# Patient Record
Sex: Female | Born: 1990 | Race: White | Hispanic: No | Marital: Single | Smoking: Current every day smoker
Health system: Southern US, Community
[De-identification: ages and names within clinical notes are randomized; demographics above are authoritative.]

## PROBLEM LIST (undated history)

## (undated) ENCOUNTER — Inpatient Hospital Stay: Payer: Self-pay

## (undated) DIAGNOSIS — A6009 Herpesviral infection of other urogenital tract: Secondary | ICD-10-CM

## (undated) DIAGNOSIS — D649 Anemia, unspecified: Secondary | ICD-10-CM

## (undated) DIAGNOSIS — N83209 Unspecified ovarian cyst, unspecified side: Secondary | ICD-10-CM

## (undated) HISTORY — PX: FRACTURE SURGERY: SHX138

## (undated) HISTORY — PX: TONSILLECTOMY: SUR1361

## (undated) HISTORY — DX: Unspecified ovarian cyst, unspecified side: N83.209

---
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
## 2010-01-16 ENCOUNTER — Ambulatory Visit: Payer: Self-pay | Admitting: Family Medicine

## 2011-02-01 ENCOUNTER — Observation Stay: Payer: Self-pay

## 2011-03-19 ENCOUNTER — Observation Stay: Payer: Self-pay

## 2011-03-23 ENCOUNTER — Inpatient Hospital Stay: Payer: Self-pay | Admitting: Obstetrics and Gynecology

## 2014-02-13 ENCOUNTER — Ambulatory Visit: Payer: Self-pay | Admitting: Emergency Medicine

## 2014-02-13 LAB — URINALYSIS, COMPLETE
Bilirubin,UR: NEGATIVE
Glucose,UR: NEGATIVE mg/dL (ref 0–75)
KETONE: NEGATIVE
Nitrite: POSITIVE
PH: 6 (ref 4.5–8.0)
Specific Gravity: >=1.03 (ref 1.003–1.030)

## 2014-02-20 ENCOUNTER — Ambulatory Visit: Payer: Self-pay

## 2014-02-20 LAB — URINALYSIS, COMPLETE
Bilirubin,UR: NEGATIVE
GLUCOSE, UR: NEGATIVE mg/dL (ref 0–75)
Ketone: NEGATIVE
LEUKOCYTE ESTERASE: NEGATIVE
NITRITE: NEGATIVE
Ph: 6 (ref 4.5–8.0)
Protein: NEGATIVE
SPECIFIC GRAVITY: 1.01 (ref 1.003–1.030)

## 2014-02-20 LAB — PREGNANCY, URINE: PREGNANCY TEST, URINE: NEGATIVE m[IU]/mL

## 2014-02-22 LAB — URINE CULTURE

## 2014-02-26 ENCOUNTER — Ambulatory Visit: Payer: Self-pay | Admitting: Physician Assistant

## 2014-02-26 LAB — CBC WITH DIFFERENTIAL/PLATELET
BASOS PCT: 0.7 %
Basophil #: 0.1 10*3/uL (ref 0.0–0.1)
EOS PCT: 0.9 %
Eosinophil #: 0.1 10*3/uL (ref 0.0–0.7)
HCT: 38.4 % (ref 35.0–47.0)
HGB: 12.8 g/dL (ref 12.0–16.0)
LYMPHS PCT: 26.3 %
Lymphocyte #: 2 10*3/uL (ref 1.0–3.6)
MCH: 29.7 pg (ref 26.0–34.0)
MCHC: 33.3 g/dL (ref 32.0–36.0)
MCV: 89 fL (ref 80–100)
MONO ABS: 0.5 x10 3/mm (ref 0.2–0.9)
MONOS PCT: 6.1 %
Neutrophil #: 4.9 10*3/uL (ref 1.4–6.5)
Neutrophil %: 66 %
Platelet: 227 10*3/uL (ref 150–440)
RBC: 4.31 10*6/uL (ref 3.80–5.20)
RDW: 13.6 % (ref 11.5–14.5)
WBC: 7.4 10*3/uL (ref 3.6–11.0)

## 2014-02-26 LAB — URINALYSIS, COMPLETE
BILIRUBIN, UR: NEGATIVE
Blood: NEGATIVE
GLUCOSE, UR: NEGATIVE mg/dL (ref 0–75)
Ketone: NEGATIVE
Leukocyte Esterase: NEGATIVE
NITRITE: NEGATIVE
Ph: 8 (ref 4.5–8.0)
Protein: NEGATIVE
Specific Gravity: 1.01 (ref 1.003–1.030)

## 2014-02-26 LAB — BASIC METABOLIC PANEL
Anion Gap: 6 — ABNORMAL LOW (ref 7–16)
BUN: 13 mg/dL (ref 7–18)
CO2: 31 mmol/L (ref 21–32)
Calcium, Total: 9.1 mg/dL (ref 8.5–10.1)
Chloride: 102 mmol/L (ref 98–107)
Creatinine: 0.81 mg/dL (ref 0.60–1.30)
EGFR (African American): >60
EGFR (Non-African Amer.): >60
GLUCOSE: 88 mg/dL (ref 65–99)
Osmolality: 277 (ref 275–301)
Potassium: 3.7 mmol/L (ref 3.5–5.1)
Sodium: 139 mmol/L (ref 136–145)

## 2014-07-24 ENCOUNTER — Ambulatory Visit: Payer: Self-pay | Admitting: Otolaryngology

## 2015-04-08 ENCOUNTER — Other Ambulatory Visit: Payer: Self-pay | Admitting: Nurse Practitioner

## 2015-04-08 DIAGNOSIS — R1032 Left lower quadrant pain: Secondary | ICD-10-CM

## 2015-04-08 DIAGNOSIS — K5909 Other constipation: Secondary | ICD-10-CM

## 2015-04-15 ENCOUNTER — Ambulatory Visit: Payer: Self-pay

## 2015-04-24 ENCOUNTER — Ambulatory Visit
Admission: RE | Admit: 2015-04-24 | Discharge: 2015-04-24 | Disposition: A | Discharge status: Home / Self Care | Payer: Managed Care, Other (non HMO) | Source: Ambulatory Visit | Attending: Nurse Practitioner | Admitting: Nurse Practitioner

## 2015-04-24 DIAGNOSIS — R1032 Left lower quadrant pain: Secondary | ICD-10-CM | POA: Diagnosis present

## 2015-04-24 DIAGNOSIS — K5909 Other constipation: Secondary | ICD-10-CM | POA: Diagnosis present

## 2015-04-24 DIAGNOSIS — N8320 Unspecified ovarian cysts: Secondary | ICD-10-CM | POA: Diagnosis not present

## 2015-04-24 MED ORDER — IOHEXOL 300 MG/ML  SOLN
100.0000 mL | Freq: Once | INTRAMUSCULAR | Status: AC | PRN
  Administered 2015-04-24: 100 mL via INTRAVENOUS

## 2015-11-22 NOTE — L&D Delivery Note (Signed)
Delivery Note Primary OB: [facility] Delivery Physician: Annamarie MajorPaul Harris, MD Gestational Age: Full term Antepartum complications: none Intrapartum complications: None  A viable Female was delivered via vertex perentation.  Apgars:7 ,9  Weight:  8 lb 6 oz .   Placenta status: spontaneous and Intact.  Cord: 3+ vessels;  with the following complications: none.  Anesthesia:  none Episiotomy:  none Lacerations:  none Suture Repair: none Est. Blood Loss (mL):  200 mL  Mom to postpartum.  Baby to Couplet care / Skin to Skin.  Annamarie MajorPaul Harris, MD Dept of OB/GYN [phone]

## 2015-12-19 LAB — OB RESULTS CONSOLE HIV ANTIBODY (ROUTINE TESTING): HIV: NONREACTIVE

## 2015-12-19 LAB — OB RESULTS CONSOLE ABO/RH: RH TYPE: POSITIVE

## 2015-12-19 LAB — OB RESULTS CONSOLE HEPATITIS B SURFACE ANTIGEN: Hepatitis B Surface Ag: NEGATIVE

## 2015-12-19 LAB — OB RESULTS CONSOLE VARICELLA ZOSTER ANTIBODY, IGG: Varicella: IMMUNE

## 2015-12-19 LAB — OB RESULTS CONSOLE RPR: RPR: NONREACTIVE

## 2015-12-19 LAB — OB RESULTS CONSOLE RUBELLA ANTIBODY, IGM: RUBELLA: IMMUNE

## 2015-12-19 LAB — OB RESULTS CONSOLE ANTIBODY SCREEN: Antibody Screen: NEGATIVE

## 2016-02-19 ENCOUNTER — Emergency Department
Admission: EM | Admit: 2016-02-19 | Discharge: 2016-02-19 | Disposition: A | Discharge status: Home / Self Care | Payer: BLUE CROSS/BLUE SHIELD | Attending: Emergency Medicine | Admitting: Emergency Medicine

## 2016-02-19 ENCOUNTER — Encounter: Payer: Self-pay | Admitting: Emergency Medicine

## 2016-02-19 DIAGNOSIS — Z3A16 16 weeks gestation of pregnancy: Secondary | ICD-10-CM | POA: Insufficient documentation

## 2016-02-19 DIAGNOSIS — O99612 Diseases of the digestive system complicating pregnancy, second trimester: Secondary | ICD-10-CM | POA: Insufficient documentation

## 2016-02-19 DIAGNOSIS — K929 Disease of digestive system, unspecified: Secondary | ICD-10-CM | POA: Insufficient documentation

## 2016-02-19 DIAGNOSIS — O210 Mild hyperemesis gravidarum: Secondary | ICD-10-CM

## 2016-02-19 LAB — URINALYSIS COMPLETE WITH MICROSCOPIC (ARMC ONLY)
Bacteria, UA: NONE SEEN
Bilirubin Urine: NEGATIVE
Glucose, UA: NEGATIVE mg/dL
HGB URINE DIPSTICK: NEGATIVE
LEUKOCYTES UA: NEGATIVE
NITRITE: NEGATIVE
PH: 7 (ref 5.0–8.0)
PROTEIN: NEGATIVE mg/dL
SPECIFIC GRAVITY, URINE: 1.017 (ref 1.005–1.030)
WBC UA: NONE SEEN WBC/hpf (ref 0–5)

## 2016-02-19 LAB — COMPREHENSIVE METABOLIC PANEL
ALBUMIN: 3.9 g/dL (ref 3.5–5.0)
ALT: 14 U/L (ref 14–54)
ANION GAP: 5 (ref 5–15)
AST: 21 U/L (ref 15–41)
Alkaline Phosphatase: 32 U/L — ABNORMAL LOW (ref 38–126)
BUN: 6 mg/dL (ref 6–20)
CHLORIDE: 107 mmol/L (ref 101–111)
CO2: 21 mmol/L — ABNORMAL LOW (ref 22–32)
Calcium: 9.1 mg/dL (ref 8.9–10.3)
Creatinine, Ser: 0.39 mg/dL — ABNORMAL LOW (ref 0.44–1.00)
GFR calc Af Amer: >60 mL/min (ref >60)
Glucose, Bld: 83 mg/dL (ref 65–99)
POTASSIUM: 3.7 mmol/L (ref 3.5–5.1)
Sodium: 133 mmol/L — ABNORMAL LOW (ref 135–145)
TOTAL PROTEIN: 7.5 g/dL (ref 6.5–8.1)
Total Bilirubin: 0.6 mg/dL (ref 0.3–1.2)

## 2016-02-19 LAB — CBC WITH DIFFERENTIAL/PLATELET
BASOS ABS: 0 10*3/uL (ref 0–0.1)
BASOS PCT: 1 %
EOS PCT: 1 %
Eosinophils Absolute: 0 10*3/uL (ref 0–0.7)
HCT: 35.5 % (ref 35.0–47.0)
Hemoglobin: 12.3 g/dL (ref 12.0–16.0)
Lymphocytes Relative: 19 %
Lymphs Abs: 1.2 10*3/uL (ref 1.0–3.6)
MCH: 30 pg (ref 26.0–34.0)
MCHC: 34.6 g/dL (ref 32.0–36.0)
MCV: 86.5 fL (ref 80.0–100.0)
MONO ABS: 0.5 10*3/uL (ref 0.2–0.9)
MONOS PCT: 8 %
Neutro Abs: 4.5 10*3/uL (ref 1.4–6.5)
Neutrophils Relative %: 71 %
PLATELETS: 185 10*3/uL (ref 150–440)
RBC: 4.11 MIL/uL (ref 3.80–5.20)
RDW: 12.8 % (ref 11.5–14.5)
WBC: 6.3 10*3/uL (ref 3.6–11.0)

## 2016-02-19 LAB — LIPASE, BLOOD: LIPASE: 22 U/L (ref 11–51)

## 2016-02-19 MED ORDER — SODIUM CHLORIDE 0.9 % IV BOLUS (SEPSIS)
1000.0000 mL | Freq: Once | INTRAVENOUS | Status: AC
  Administered 2016-02-19: 1000 mL via INTRAVENOUS

## 2016-02-19 MED ORDER — ONDANSETRON HCL 4 MG PO TABS: 4.0000 mg | ORAL_TABLET | Freq: Every day | ORAL | Status: DC | PRN

## 2016-02-19 MED ORDER — ONDANSETRON HCL 4 MG/2ML IJ SOLN
4.0000 mg | Freq: Once | INTRAMUSCULAR | Status: AC
  Administered 2016-02-19: 4 mg via INTRAVENOUS
  Filled 2016-02-19: qty 2

## 2016-02-19 NOTE — ED Notes (Signed)
Fetal heartrate assessed, Approx 150BPM

## 2016-02-19 NOTE — ED Provider Notes (Signed)
[facility] Emergency Department Provider Note  ____________________________________________  Time seen: Approximately 3:25 PM  I have reviewed the triage vital signs and the nursing notes.   HISTORY  Chief Complaint Emesis    HPI Barbara Moreno is a 25 y.o. female who is a G2 who is presenting today with multiple episodes of vomiting since yesterday. She said that she had about 6 episodes of vomiting yesterday and several episodes this morning. She says that they have been yellow to green without any blood in them. She denies any diarrhea. She says that she has struggled with nausea and vomiting through both of her pregnancies. She says that she is already tried Diclegis as well as promethazine this pregnancy ate unsuccessfully. She says that the only thing that works for Zofran. She says that she has discussed with her OB/GYN and the possible risks of birth defects with Zofran and she is aware of them but says this is the only thing that works for her. We discussed Reglan at this time the patient says that she does not want this medication either.  She is also reporting that she was earlier having right-sided flank pain which she said was sharp and constant. She denies any right upper quadrant abdominal pain and says that she has not been having any right upper quadrant abdominal pain with eating. She does still have her gallbladder. Denies any burning with urination. Denies any bleeding or discharge from the vagina. She is a patient at [facility]. Denies any chest pain or shortness of breath. Denies any pain to the right shoulder blade as reported in the H&P. The pain is more to the right flank.   History reviewed. No pertinent past medical history.  There are no active problems to display for this patient.   History reviewed. No pertinent past surgical history.  No current outpatient prescriptions on file.  Allergies Penicillins  No family history  on file.  Social History Social History  Substance Use Topics  . Smoking status: Never Smoker   . Smokeless tobacco: None  . Alcohol Use: None    Review of Systems Constitutional: No fever/chills Eyes: No visual changes. ENT: No sore throat. Cardiovascular: Denies chest pain. Respiratory: Denies shortness of breath. Gastrointestinal: Says having lower abdominal pain but this is chronic. Pain is mild. Unchanged from previous.  No diarrhea. Genitourinary: Negative for dysuria. Musculoskeletal: Negative for back pain. Skin: Negative for rash. Neurological: Negative for headaches, focal weakness or numbness.  10-point ROS otherwise negative.  ____________________________________________   PHYSICAL EXAM:  VITAL SIGNS: ED Triage Vitals  Enc Vitals Group     BP 02/19/16 1035 119/68 mmHg     Pulse Rate 02/19/16 1035 119     Resp 02/19/16 1035 18     Temp 02/19/16 1035 98.2 F (36.8 C)     Temp Source 02/19/16 1035 Oral     SpO2 02/19/16 1035 97 %     Weight 02/19/16 1035 160 lb (72.576 kg)     Height 02/19/16 1035 5\' 6"  (1.676 m)     Head Cir --      Peak Flow --      Pain Score 02/19/16 1036 8     Pain Loc --      Pain Edu? --      Excl. in GC? --     Constitutional: Alert and oriented. Well appearing and in no acute distress. Eyes: Conjunctivae are normal. PERRL. EOMI. Head: Atraumatic. Nose: No congestion/rhinnorhea.  Mouth/Throat: Mucous membranes are moist.   Neck: No stridor.   Cardiovascular: Normal rate, regular rhythm. Grossly normal heart sounds.  Good peripheral circulation. Respiratory: Normal respiratory effort.  No retractions. Lungs CTAB. Gastrointestinal: Soft And mild tenderness across the lower abdomen with a palpable uterus about 4 cm above the pubic symphysis. No bogginess. No rebound or guarding. No distention. No CVA tenderness. No right upper quadrant tenderness to palpation. Negative Murphy sign. Musculoskeletal: No lower extremity tenderness  nor edema.  No joint effusions. Neurologic:  Normal speech and language. No gross focal neurologic deficits are appreciated. No gait instability. Skin:  Skin is warm, dry and intact. No rash noted. Psychiatric: Mood and affect are normal. Speech and behavior are normal.  ____________________________________________   LABS (all labs ordered are listed, but only abnormal results are displayed)  Labs Reviewed  COMPREHENSIVE METABOLIC PANEL - Abnormal; Notable for the following:    Sodium 133 (*)    CO2 21 (*)    Creatinine, Ser 0.39 (*)    Alkaline Phosphatase 32 (*)    All other components within normal limits  URINALYSIS COMPLETEWITH MICROSCOPIC ([facility] ONLY) - Abnormal; Notable for the following:    Color, Urine YELLOW (*)    APPearance CLEAR (*)    Ketones, ur 2+ (*)    Squamous Epithelial / LPF 6-30 (*)    All other components within normal limits  CBC WITH DIFFERENTIAL/PLATELET  LIPASE, BLOOD   ____________________________________________  EKG   ____________________________________________  RADIOLOGY   ____________________________________________   PROCEDURES   ____________________________________________   INITIAL IMPRESSION / ASSESSMENT AND PLAN / ED COURSE  Pertinent labs & imaging results that were available during my care of the patient were reviewed by me and considered in my medical decision making (see chart for details).  ----------------------------------------- 3:49 PM on 02/19/2016 ----------------------------------------- After Zofran and fluids the patient says that she feels markedly improved. She says that the pain has resolved in her right flank and she is no longer vomiting. She tolerated juice as well as crackers and says that she feels very hungry. She says that she would prefer to go home with a short course of Zofran. She is aware of the risks of birth defects but says this is the only medication that she has tried that she has had any  success with. She did not want a prescription for Reglan either. She will be following up with her OB/GYN this Tuesday. Fetal heart tones in the 150s.  ____________________________________________   FINAL CLINICAL IMPRESSION(S) / ED DIAGNOSES  Hyperemesis gravidarum.    Myrna Blazer, MD 02/19/16 [phone]

## 2016-02-19 NOTE — ED Notes (Signed)
Pt states she is [redacted]wks pregnant.  C/o pain in right shoulder blade. N/v this am.

## 2016-02-19 NOTE — Discharge Instructions (Signed)
Hyperemesis Gravidarum  Hyperemesis gravidarum is a severe form of nausea and vomiting that happens during pregnancy. Hyperemesis is worse than morning sickness. It may cause you to have nausea or vomiting all day for many days. It may keep you from eating and drinking enough food and liquids. Hyperemesis usually occurs during the first half (the first 20 weeks) of pregnancy. It often goes away once a woman is in her second half of pregnancy. However, sometimes hyperemesis continues through an entire pregnancy.   CAUSES   The cause of this condition is not completely known but is thought to be related to changes in the body's hormones when pregnant. It could be from the high level of the pregnancy hormone or an increase in estrogen in the body.   SIGNS AND SYMPTOMS    Severe nausea and vomiting.   Nausea that does not go away.   Vomiting that does not allow you to keep any food down.   Weight loss and body fluid loss (dehydration).   Having no desire to eat or not liking food you have previously enjoyed.  DIAGNOSIS   Your health care provider will do a physical exam and ask you about your symptoms. He or she may also order blood tests and urine tests to make sure something else is not causing the problem.   TREATMENT   You may only need medicine to control the problem. If medicines do not control the nausea and vomiting, you will be treated in the hospital to prevent dehydration, increased acid in the blood (acidosis), weight loss, and changes in the electrolytes in your body that may harm the unborn baby (fetus). You may need IV fluids.   HOME CARE INSTRUCTIONS    Only take over-the-counter or prescription medicines as directed by your health care provider.   Try eating a couple of dry crackers or toast in the morning before getting out of bed.   Avoid foods and smells that upset your stomach.   Avoid fatty and spicy foods.   Eat 5-6 small meals a day.   Do not drink when eating meals. Drink between  meals.   For snacks, eat high-protein foods, such as cheese.   Eat or suck on things that have ginger in them. Ginger helps nausea.   Avoid food preparation. The smell of food can spoil your appetite.   Avoid iron pills and iron in your multivitamins until after 3-4 months of being pregnant. However, consult with your health care provider before stopping any prescribed iron pills.  SEEK MEDICAL CARE IF:    Your abdominal pain increases.   You have a severe headache.   You have vision problems.   You are losing weight.  SEEK IMMEDIATE MEDICAL CARE IF:    You are unable to keep fluids down.   You vomit blood.   You have constant nausea and vomiting.   You have excessive weakness.   You have extreme thirst.   You have dizziness or fainting.   You have a fever or persistent symptoms for more than 2-3 days.   You have a fever and your symptoms suddenly get worse.  MAKE SURE YOU:    Understand these instructions.   Will watch your condition.   Will get help right away if you are not doing well or get worse.     This information is not intended to replace advice given to you by your health care provider. Make sure you discuss any questions you have with   your health care provider.     Document Released: 11/07/2005 Document Revised: 08/28/2013 Document Reviewed: 06/19/2013  Elsevier Interactive Patient Education 2016 Elsevier Inc.

## 2016-07-12 LAB — OB RESULTS CONSOLE GC/CHLAMYDIA
CHLAMYDIA, DNA PROBE: NEGATIVE
GC PROBE AMP, GENITAL: NEGATIVE

## 2016-07-12 LAB — OB RESULTS CONSOLE GBS: GBS: NEGATIVE

## 2016-08-02 ENCOUNTER — Encounter: Payer: Self-pay | Admitting: *Deleted

## 2016-08-02 ENCOUNTER — Observation Stay
Admission: EM | Admit: 2016-08-02 | Discharge: 2016-08-02 | Disposition: A | Discharge status: Home / Self Care | Payer: BLUE CROSS/BLUE SHIELD | Attending: Obstetrics and Gynecology | Admitting: Obstetrics and Gynecology

## 2016-08-02 DIAGNOSIS — O471 False labor at or after 37 completed weeks of gestation: Principal | ICD-10-CM | POA: Insufficient documentation

## 2016-08-02 DIAGNOSIS — Z3A39 39 weeks gestation of pregnancy: Secondary | ICD-10-CM | POA: Diagnosis not present

## 2016-08-02 DIAGNOSIS — O479 False labor, unspecified: Secondary | ICD-10-CM | POA: Diagnosis present

## 2016-08-02 NOTE — OB Triage Note (Signed)
Pt presents with complaint of dizziness, and "trickling of fluid" for a couple of days. Having some nausea, no vomiting. Denies headache. States that "she just hasnt felt good."

## 2016-08-02 NOTE — Discharge Summary (Signed)
Obstetric History and Physical  Barbara Moreno is a 25 y.o. G2P1 with Estimated Date of Delivery: 08/06/16 who presents at 6317w3d presenting for leaking fluid x 2 days and contraction. Patient states she has been having regular contractions, no vaginal bleeding, intact, ? Leaking clear membranes, with active fetal movement.    Prenatal Course Source of Care: [facility]  Pregnancy complications or risks: Patient Active Problem List   Diagnosis Date Noted  . Irregular contractions 08/02/2016     Prenatal Transfer Tool   No past medical history on file.  Past Surgical History:  Procedure Laterality Date  . FRACTURE SURGERY      OB History  Gravida Para Term Preterm AB Living  2 1       1   SAB TAB Ectopic Multiple Live Births               # Outcome Date GA Lbr Len/2nd Weight Sex Delivery Anes PTL Lv  2 Current           1 Para               Social History   Social History  . Marital status: Single    Spouse name: N/A  . Number of children: N/A  . Years of education: N/A   Social History Main Topics  . Smoking status: Former Games developermoker  . Smokeless tobacco: Never Used  . Alcohol use No  . Drug use: No  . Sexual activity: Not on file   Other Topics Concern  . Not on file   Social History Narrative  . No narrative on file    No family history on file.  Prescriptions Prior to Admission  Medication Sig Dispense Refill Last Dose  . ondansetron (ZOFRAN) 4 MG tablet Take 1 tablet (4 mg total) by mouth daily as needed. 5 tablet 0   PNV  Allergies  Allergen Reactions  . Penicillins     hives    Review of Systems: Negative except for what is mentioned in HPI.  Physical Exam: Temp 97.5 F (36.4 C) (Oral)   Resp 20   Ht 5\' 2"  (1.575 m)   Wt 155 lb (70.3 kg)   BMI 28.35 kg/m  GENERAL: Well-developed, well-nourished female in no acute distress.  LUNGS: Clear to auscultation bilaterally.  HEART: Regular rate and rhythm. ABDOMEN: Soft, nontender, nondistended,  gravid. EXTREMITIES: Nontender, no edema Cervical Exam: Dilatation 1-1.5cm   Effacement 50%   Station -2   Presentation: cephalic FHT: Category: 1 Baseline rate 140 bpm   Variability moderate  Accelerations present   Decelerations none Contractions: Every 6 mins   Pertinent Labs/Studies:   No results found for this or any previous visit (from the past 24 hour(s)).  Assessment : IUP at 1117w3d, early vs false labor  Plan: Discharge Home Labor precautions

## 2016-08-09 ENCOUNTER — Inpatient Hospital Stay
Admission: EM | Admit: 2016-08-09 | Discharge: 2016-08-12 | DRG: 767 | Disposition: A | Discharge status: Home / Self Care | Payer: BLUE CROSS/BLUE SHIELD | Attending: Obstetrics & Gynecology | Admitting: Obstetrics & Gynecology

## 2016-08-09 ENCOUNTER — Encounter: Payer: Self-pay | Admitting: Certified Nurse Midwife

## 2016-08-09 DIAGNOSIS — Z3A40 40 weeks gestation of pregnancy: Secondary | ICD-10-CM

## 2016-08-09 DIAGNOSIS — O9902 Anemia complicating childbirth: Secondary | ICD-10-CM | POA: Diagnosis present

## 2016-08-09 DIAGNOSIS — D62 Acute posthemorrhagic anemia: Secondary | ICD-10-CM | POA: Diagnosis present

## 2016-08-09 DIAGNOSIS — A609 Anogenital herpesviral infection, unspecified: Secondary | ICD-10-CM | POA: Diagnosis present

## 2016-08-09 DIAGNOSIS — Z302 Encounter for sterilization: Secondary | ICD-10-CM | POA: Diagnosis not present

## 2016-08-09 DIAGNOSIS — O480 Post-term pregnancy: Secondary | ICD-10-CM | POA: Diagnosis present

## 2016-08-09 DIAGNOSIS — O4103X0 Oligohydramnios, third trimester, not applicable or unspecified: Principal | ICD-10-CM | POA: Diagnosis present

## 2016-08-09 DIAGNOSIS — Z87891 Personal history of nicotine dependence: Secondary | ICD-10-CM

## 2016-08-09 DIAGNOSIS — O9832 Other infections with a predominantly sexual mode of transmission complicating childbirth: Secondary | ICD-10-CM | POA: Diagnosis present

## 2016-08-09 DIAGNOSIS — O4100X0 Oligohydramnios, unspecified trimester, not applicable or unspecified: Secondary | ICD-10-CM | POA: Diagnosis present

## 2016-08-09 HISTORY — DX: Anemia, unspecified: D64.9

## 2016-08-09 HISTORY — DX: Herpesviral infection of other urogenital tract: A60.09

## 2016-08-09 LAB — CBC
HCT: 37.1 % (ref 35.0–47.0)
Hemoglobin: 12.7 g/dL (ref 12.0–16.0)
MCH: 28.7 pg (ref 26.0–34.0)
MCHC: 34.2 g/dL (ref 32.0–36.0)
MCV: 83.9 fL (ref 80.0–100.0)
PLATELETS: 150 10*3/uL (ref 150–440)
RBC: 4.42 MIL/uL (ref 3.80–5.20)
RDW: 13.8 % (ref 11.5–14.5)
WBC: 7.8 10*3/uL (ref 3.6–11.0)

## 2016-08-09 LAB — CHLAMYDIA/NGC RT PCR (ARMC ONLY)
Chlamydia Tr: NOT DETECTED
N gonorrhoeae: NOT DETECTED

## 2016-08-09 LAB — TYPE AND SCREEN
ABO/RH(D): A POS
Antibody Screen: NEGATIVE

## 2016-08-09 MED ORDER — MISOPROSTOL 200 MCG PO TABS: 800.0000 ug | ORAL_TABLET | Freq: Once | ORAL | Status: DC | PRN

## 2016-08-09 MED ORDER — ONDANSETRON 4 MG PO TBDP
8.0000 mg | ORAL_TABLET | Freq: Once | ORAL | Status: AC
  Administered 2016-08-09: 8 mg via ORAL

## 2016-08-09 MED ORDER — LACTATED RINGERS IV SOLN: 500.0000 mL | INTRAVENOUS | Status: DC | PRN

## 2016-08-09 MED ORDER — LIDOCAINE HCL (PF) 1 % IJ SOLN: 30.0000 mL | INTRAMUSCULAR | Status: DC | PRN

## 2016-08-09 MED ORDER — DINOPROSTONE 10 MG VA INST
10.0000 mg | VAGINAL_INSERT | Freq: Once | VAGINAL | Status: AC
  Administered 2016-08-09: 10 mg via VAGINAL
  Filled 2016-08-09 (×2): qty 1

## 2016-08-09 MED ORDER — ONDANSETRON 4 MG PO TBDP
8.0000 mg | ORAL_TABLET | Freq: Once | ORAL | Status: DC
  Filled 2016-08-09: qty 2

## 2016-08-09 MED ORDER — LACTATED RINGERS IV SOLN: INTRAVENOUS | Status: DC

## 2016-08-09 MED ORDER — OXYTOCIN 40 UNITS IN LACTATED RINGERS INFUSION - SIMPLE MED: 2.5000 [IU]/h | INTRAVENOUS | Status: DC

## 2016-08-09 MED ORDER — SODIUM CHLORIDE FLUSH 0.9 % IV SOLN
INTRAVENOUS | Status: AC
  Administered 2016-08-10: 10 mL
  Filled 2016-08-09: qty 10

## 2016-08-09 MED ORDER — ONDANSETRON HCL 4 MG/2ML IJ SOLN: 4.0000 mg | Freq: Four times a day (QID) | INTRAMUSCULAR | Status: DC | PRN

## 2016-08-09 MED ORDER — AMMONIA AROMATIC IN INHA: 0.3000 mL | Freq: Once | RESPIRATORY_TRACT | Status: DC | PRN

## 2016-08-09 MED ORDER — OXYTOCIN BOLUS FROM INFUSION: 500.0000 mL | Freq: Once | INTRAVENOUS | Status: DC

## 2016-08-09 NOTE — H&P (Signed)
OB History & Physical   History of Present Illness:  Chief Complaint:   HPI:  Barbara Moreno is a 25 y.o. 452P1001 female with EDC=08/06/2016 at 7667w3d dated by her LMP=8 week ultrasound.  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  Has gained 12# this pregnancy. History of macrosomic infant with G1: son weighed 8#15oz..  She presents to L&D for borderline oligohydraminos. AFI today=5cm. Has been having pelvic pressure/pain, and contractions. Has been nauseated today and vomited on a couple of occasions. Baby active  Prenatal care site: [facility] . TDAP given 06/14/2016. Desires to breast feed      Maternal Medical History:   Past Medical History:  Diagnosis Date  . Anemia   . Herpes genitalis in women     Past Surgical History:  Procedure Laterality Date  . FRACTURE SURGERY     ankle surgery  . TONSILLECTOMY      Allergies  Allergen Reactions  . Penicillins     hives    Prior to Admission medications   Medication Sig Start Date End Date Taking? Authorizing Provider  Prenatal MV-Min-FA-Omega-3 (PRENATAL GUMMIES/DHA & FA PO) Take 1 tablet by mouth daily.   Yes Historical Provider, MD  valACYclovir (VALTREX) 500 MG tablet Take 500 mg by mouth daily.   Yes Historical Provider, MD  ondansetron (ZOFRAN) 4 MG tablet Take 1 tablet (4 mg total) by mouth daily as needed. Patient taking differently: Take 8 mg by mouth every 8 (eight) hours as needed for nausea or vomiting.  02/19/16   Myrna Blazeravid Matthew Schaevitz, MD          Social History: She  reports that she has quit smoking. She has never used smokeless tobacco. She reports that she does not drink alcohol or use drugs.  Family History: family history is not on file.   Review of Systems: Negative x 10 systems reviewed except as noted in the HPI.       Physical Exam:  Vital Signs: 102/66. 97.1-97 General: no acute distress.  HEENT: normocephalic, atraumatic Heart: regular rate & rhythm.  No murmurs Lungs: clear to auscultation bilaterally Abdomen: soft, gravid, non-tender;  EFW: 7 1/2# Pelvic:   External: Normal external female genitalia  Cervix: 1-2/50%/-3 per Dr Tiburcio PeaHarris' exam in office  Extremities: non-tender, symmetric, no edema bilaterally.  DTRs: Neurologic: Alert & oriented x 3.    Pertinent Results:  Prenatal Labs: Blood type/Rh A positive  Antibody screen negative  Rubella Varicella Immune immune  RPR Non reactive  HBsAg negative  HIV Non reactive  GC negative  Chlamydia negative  Genetic screening declined  1 hour GTT 99  3 hour GTT NA  GBS negative on 07/12/16   FHR: 135 with accelerations to 180s with moderate variability Toco: irregular contractions    Assessment:  Barbara Moreno is a 25 y.o. 582P1001 female at 1067w3d with borderline oligo at term  Plan:  1. Admit to Labor & Delivery for induction of labor  2. Zofran po for nausea-can eat prior to starting induction if desires 3. GBS negative   4. Consents obtained. 5. Plan for Cervidil induction tonight after moving to LDR  Tabius Rood  08/09/2016 5:26 PM

## 2016-08-09 NOTE — Plan of Care (Signed)
Pt sent over from office with AFI of 5 and postdates

## 2016-08-10 LAB — RPR: RPR Ser Ql: NONREACTIVE

## 2016-08-10 MED ORDER — SODIUM CHLORIDE 0.9% FLUSH: 3.0000 mL | Freq: Two times a day (BID) | INTRAVENOUS | Status: DC

## 2016-08-10 MED ORDER — LACTATED RINGERS IV SOLN: 500.0000 mL | INTRAVENOUS | Status: DC | PRN

## 2016-08-10 MED ORDER — ONDANSETRON HCL 4 MG/2ML IJ SOLN
4.0000 mg | INTRAMUSCULAR | Status: DC | PRN
  Administered 2016-08-11: 4 mg via INTRAVENOUS

## 2016-08-10 MED ORDER — OXYCODONE-ACETAMINOPHEN 5-325 MG PO TABS
2.0000 | ORAL_TABLET | ORAL | Status: DC | PRN
  Administered 2016-08-11: 2 via ORAL
  Filled 2016-08-10: qty 2

## 2016-08-10 MED ORDER — ACETAMINOPHEN 325 MG PO TABS: 650.0000 mg | ORAL_TABLET | ORAL | Status: DC | PRN

## 2016-08-10 MED ORDER — WITCH HAZEL-GLYCERIN EX PADS
1.0000 "application " | MEDICATED_PAD | CUTANEOUS | Status: DC | PRN
  Administered 2016-08-11: 1 via TOPICAL
  Filled 2016-08-10: qty 100

## 2016-08-10 MED ORDER — LACTATED RINGERS IV SOLN
INTRAVENOUS | Status: DC
  Administered 2016-08-10: 1000 mL via INTRAVENOUS

## 2016-08-10 MED ORDER — TRIAZOLAM 0.25 MG PO TABS: 0.2500 mg | ORAL_TABLET | Freq: Every evening | ORAL | Status: DC | PRN

## 2016-08-10 MED ORDER — BENZOCAINE-MENTHOL 20-0.5 % EX AERO
1.0000 "application " | INHALATION_SPRAY | CUTANEOUS | Status: DC | PRN
  Administered 2016-08-11: 1 via TOPICAL
  Filled 2016-08-10: qty 56

## 2016-08-10 MED ORDER — DIBUCAINE 1 % RE OINT: 1.0000 "application " | TOPICAL_OINTMENT | RECTAL | Status: DC | PRN

## 2016-08-10 MED ORDER — OXYTOCIN 40 UNITS IN LACTATED RINGERS INFUSION - SIMPLE MED
1.0000 m[IU]/min | INTRAVENOUS | Status: DC
  Administered 2016-08-10: 1 m[IU]/min via INTRAVENOUS
  Filled 2016-08-10: qty 1000

## 2016-08-10 MED ORDER — ZOLPIDEM TARTRATE 5 MG PO TABS: 5.0000 mg | ORAL_TABLET | Freq: Every evening | ORAL | Status: DC | PRN

## 2016-08-10 MED ORDER — LIDOCAINE HCL (PF) 1 % IJ SOLN
INTRAMUSCULAR | Status: AC
  Filled 2016-08-10: qty 30

## 2016-08-10 MED ORDER — OXYTOCIN BOLUS FROM INFUSION: 500.0000 mL | Freq: Once | INTRAVENOUS | Status: DC

## 2016-08-10 MED ORDER — OXYTOCIN 40 UNITS IN LACTATED RINGERS INFUSION - SIMPLE MED: 2.5000 [IU]/h | INTRAVENOUS | Status: DC

## 2016-08-10 MED ORDER — ONDANSETRON HCL 4 MG/2ML IJ SOLN
4.0000 mg | Freq: Four times a day (QID) | INTRAMUSCULAR | Status: DC | PRN
  Administered 2016-08-10: 4 mg via INTRAVENOUS
  Filled 2016-08-10: qty 2

## 2016-08-10 MED ORDER — BUTORPHANOL TARTRATE 1 MG/ML IJ SOLN
1.0000 mg | INTRAMUSCULAR | Status: DC | PRN
  Administered 2016-08-10 (×2): 1 mg via INTRAVENOUS
  Filled 2016-08-10 (×2): qty 1

## 2016-08-10 MED ORDER — TERBUTALINE SULFATE 1 MG/ML IJ SOLN
0.2500 mg | Freq: Once | INTRAMUSCULAR | Status: AC | PRN
  Administered 2016-08-10: 0.25 mg via SUBCUTANEOUS
  Filled 2016-08-10: qty 1

## 2016-08-10 MED ORDER — COCONUT OIL OIL
1.0000 "application " | TOPICAL_OIL | Status: DC | PRN
  Administered 2016-08-11: 1 via TOPICAL
  Filled 2016-08-10: qty 120

## 2016-08-10 MED ORDER — SODIUM CHLORIDE 0.9% FLUSH: 3.0000 mL | INTRAVENOUS | Status: DC | PRN

## 2016-08-10 MED ORDER — IBUPROFEN 600 MG PO TABS
600.0000 mg | ORAL_TABLET | Freq: Four times a day (QID) | ORAL | Status: DC
  Administered 2016-08-11 – 2016-08-12 (×5): 600 mg via ORAL
  Filled 2016-08-10 (×5): qty 1

## 2016-08-10 MED ORDER — MISOPROSTOL 200 MCG PO TABS
ORAL_TABLET | ORAL | Status: AC
  Filled 2016-08-10: qty 4

## 2016-08-10 MED ORDER — ONDANSETRON HCL 4 MG PO TABS: 4.0000 mg | ORAL_TABLET | ORAL | Status: DC | PRN

## 2016-08-10 MED ORDER — SODIUM CHLORIDE FLUSH 0.9 % IV SOLN
INTRAVENOUS | Status: AC
  Filled 2016-08-10: qty 10

## 2016-08-10 MED ORDER — SODIUM CHLORIDE 0.9 % IV SOLN: 250.0000 mL | INTRAVENOUS | Status: DC | PRN

## 2016-08-10 MED ORDER — SENNOSIDES-DOCUSATE SODIUM 8.6-50 MG PO TABS
2.0000 | ORAL_TABLET | ORAL | Status: DC
  Administered 2016-08-11: 2 via ORAL
  Filled 2016-08-10: qty 2

## 2016-08-10 MED ORDER — DIPHENHYDRAMINE HCL 25 MG PO CAPS: 25.0000 mg | ORAL_CAPSULE | Freq: Four times a day (QID) | ORAL | Status: DC | PRN

## 2016-08-10 MED ORDER — OXYTOCIN 10 UNIT/ML IJ SOLN
INTRAMUSCULAR | Status: AC
  Filled 2016-08-10: qty 2

## 2016-08-10 MED ORDER — OXYCODONE-ACETAMINOPHEN 5-325 MG PO TABS
1.0000 | ORAL_TABLET | ORAL | Status: DC | PRN
  Administered 2016-08-11 – 2016-08-12 (×2): 1 via ORAL
  Filled 2016-08-10 (×2): qty 1

## 2016-08-10 MED ORDER — AMMONIA AROMATIC IN INHA
RESPIRATORY_TRACT | Status: AC
  Filled 2016-08-10: qty 10

## 2016-08-10 MED ORDER — SIMETHICONE 80 MG PO CHEW: 80.0000 mg | CHEWABLE_TABLET | ORAL | Status: DC | PRN

## 2016-08-10 NOTE — Progress Notes (Signed)
L&D Note  S: Has eaten, in good spirits, contractions mild  O: 98.3-94-16 110/68 FHR: 130 with accelerations to 160s-170s, moderate variaiblity, no decelerations Toco: contractions q467min, q8 min Cervix:1.5%/50%/-2 Cervidil inserted Results for orders placed or performed during the hospital encounter of 08/09/16 (from the past 24 hour(s))  CBC     Status: None   Collection Time: 08/09/16  5:48 PM  Result Value Ref Range   WBC 7.8 3.6 - 11.0 K/uL   RBC 4.42 3.80 - 5.20 MIL/uL   Hemoglobin 12.7 12.0 - 16.0 g/dL   HCT 82.937.1 56.235.0 - 13.047.0 %   MCV 83.9 80.0 - 100.0 fL   MCH 28.7 26.0 - 34.0 pg   MCHC 34.2 32.0 - 36.0 g/dL   RDW 86.513.8 78.411.5 - 69.614.5 %   Platelets 150 150 - 440 K/uL  Type and screen [facility]     Status: None   Collection Time: 08/09/16  5:48 PM  Result Value Ref Range   ABO/RH(D) A POS    Antibody Screen NEG    Sample Expiration 08/12/2016   Chlamydia/NGC rt PCR ([facility] only)     Status: None   Collection Time: 08/09/16  5:48 PM  Result Value Ref Range   Specimen source GC/Chlam URINE, RANDOM    Chlamydia Tr NOT DETECTED NOT DETECTED   N gonorrhoeae NOT DETECTED NOT DETECTED   A: Borderline oligo  Cat 1 tracing  IOL  P: monitor for onset of labor/ fetal tolerance to labor  Pastor Sgro, CNM

## 2016-08-10 NOTE — Progress Notes (Signed)
Dr Tiburcio Peaharris in room. sve = 8cm. Pt feels like she has to push.

## 2016-08-10 NOTE — Progress Notes (Signed)
  Labor Progress Note   25 y.o. G2P1001 @ 8229w4d , admitted for  Pregnancy, Labor Management. OLIGOHYDRAMNIOS, Post Dates.  Subjective:  Painful Ctxs. Stadol now for pain.  Pitocin 11 mU/min.  Objective:  BP (!) 93/59 (BP Location: Right Arm)   Pulse 77   Temp 97.5 F (36.4 C) (Oral)   Resp 18   Ht 5\' 2"  (1.575 m)   Wt 152 lb (68.9 kg)   LMP 10/31/2015   BMI 27.80 kg/m  Abd: mild Extr: trace to 1+ bilateral pedal edema SVE: CERVIX: 3 cm dilated, 70 effaced, -2 station AROM clear  EFM: FHR: 140 bpm, variability: moderate,  accelerations:  Present,  decelerations:  Absent Toco: Frequency: Every 2 minutes Labs: I have reviewed the patient's lab results.  Assessment & Plan:  G2P1001 @ 1429w4d, admitted for  Pregnancy and Labor/Delivery Management  1. Pain management: Stadol. 2. FWB: FHT category 1.  3. ID: GBS negative 4. Labor management: Pitocin assisting labor management.  Stadol for pain and not epidural at this time.  5. AROM clear  All discussed with patient, see orders

## 2016-08-10 NOTE — Discharge Instructions (Signed)

## 2016-08-10 NOTE — Plan of Care (Signed)
fhr  Is 90-100's with good variability. Pt remains on 10 l o2 via mask . Dr Tiburcio Peaharris made aware . Will give terbutaline and continue to monitor closely.

## 2016-08-10 NOTE — Discharge Summary (Signed)
  Obstetrical Discharge Summary  Date of Admission: 08/09/2016 Date of Discharge: 08/12/2016  Discharge Diagnosis: Term Pregnancy-delivered, also postpartum BTL Primary OB:  [facility]   Gestational Age at Delivery: 2016w4d  Antepartum complications: none Date of Delivery: 08/12/2016  Delivered By: Annamarie MajorPaul Harris, MD Delivery Type: spontaneous vaginal delivery Intrapartum complications/course: None Anesthesia: none Placenta: spontaneous Laceration: none Episiotomy: none Live born FEMALE Birth Weight:  8-6 APGAR: 7, 9   Post partum course: Since the delivery, patient has tolerate activity, diet, and daily functions without difficulty or complication.  Min lochia.  No breast concerns at this time.  No signs of depression currently. Patient had pp BTL on PPD#1 with Dr. Bonney AidStaebler which occurred without incident.    BP 105/67 (BP Location: Left Arm)   Pulse 79   Temp 98.6 F (37 C) (Oral)   Resp 18   Ht 5\' 2"  (1.575 m)   Wt 152 lb (68.9 kg)   LMP 10/31/2015   SpO2 100%   Breastfeeding? Unknown   BMI 27.80 kg/m   Postpartum Exam:General appearance: alert, cooperative and no distress GI: soft, non-tender; bowel sounds normal; no masses,  no organomegaly and Fundus firm Extremities: extremities normal, atraumatic, no cyanosis or edema  Inc: Clean/dry/intact  Disposition: home with infant Rh Immune globulin given: not applicable Rubella vaccine given: no Varicella vaccine given: no Tdap vaccine given in AP or PP setting: given during prenatal care Flu vaccine given in AP or PP setting: given during prenatal care Contraception: bilateral tubal ligation  Prenatal Labs: A POS//Rubella Immune//RPR negative//HIV negative/HepB Surface Ag negative//plans to breastfeed, plans to bottle feed   Plan:  Barbara Moreno was discharged to home in good condition. [facility]  Discharge Medications:   Medication List    STOP taking these medications    ondansetron 4 MG tablet Commonly known as:  ZOFRAN   valACYclovir 500 MG tablet Commonly known as:  VALTREX     TAKE these medications   HYDROcodone-acetaminophen 5-325 MG tablet Commonly known as:  NORCO Take 1 tablet by mouth every 6 (six) hours as needed for moderate pain.   ibuprofen 600 MG tablet Commonly known as:  ADVIL,MOTRIN Take 1 tablet (600 mg total) by mouth every 6 (six) hours.   PRENATAL GUMMIES/DHA & FA PO Take 1 tablet by mouth daily.       Follow-up arrangements:  Follow-up Information    Letitia Libraobert Paul Harris, MD. Schedule an appointment as soon as possible for a visit in 6 week(s).   Specialty:  Obstetrics and Gynecology Contact information: [address] [place] [address] [phone]          Thomasene MohairStephen Jackson, MD 08/12/2016 12:29 PM

## 2016-08-10 NOTE — Progress Notes (Signed)
  Labor Progress Note   25 y.o. G2P1001 @ 377w4d , admitted for  Pregnancy, Labor Management. OLIGOHYDRAMNIOS, Post Dates.  Subjective:  Painful Ctxs.  Cervadil in for 12 hours.  Objective:  BP (!) 93/59 (BP Location: Right Arm)   Pulse 77   Temp 97.5 F (36.4 C) (Oral)   Resp 18   Ht 5\' 2"  (1.575 m)   Wt 152 lb (68.9 kg)   LMP 10/31/2015   BMI 27.80 kg/m  Abd: mild Extr: trace to 1+ bilateral pedal edema SVE: CERVIX: 2 cm dilated, 70 effaced, -2 station  EFM: FHR: 140 bpm, variability: moderate,  accelerations:  Present,  decelerations:  Absent Toco: Frequency: Every 5-7 minutes Labs: I have reviewed the patient's lab results.   Assessment & Plan:  G2P1001 @ 7477w4d, admitted for  Pregnancy and Labor/Delivery Management  1. Pain management: none. 2. FWB: FHT category 1.  3. ID: GBS negative 4. Labor management: Pitocin as next step for labor management.  Plans IV Stadol for pain and not epidural at this time.   All discussed with patient, see orders

## 2016-08-11 ENCOUNTER — Inpatient Hospital Stay: Payer: BLUE CROSS/BLUE SHIELD | Admitting: Anesthesiology

## 2016-08-11 ENCOUNTER — Encounter: Payer: Self-pay | Admitting: *Deleted

## 2016-08-11 ENCOUNTER — Encounter
Admission: EM | Disposition: A | Discharge status: Home / Self Care | Payer: Self-pay | Source: Home / Self Care | Attending: Obstetrics & Gynecology

## 2016-08-11 HISTORY — PX: TUBAL LIGATION: SHX77

## 2016-08-11 LAB — CBC
HCT: 34.7 % — ABNORMAL LOW (ref 35.0–47.0)
HEMOGLOBIN: 11.7 g/dL — AB (ref 12.0–16.0)
MCH: 28.5 pg (ref 26.0–34.0)
MCHC: 33.6 g/dL (ref 32.0–36.0)
MCV: 84.6 fL (ref 80.0–100.0)
Platelets: 169 10*3/uL (ref 150–440)
RBC: 4.1 MIL/uL (ref 3.80–5.20)
RDW: 14.4 % (ref 11.5–14.5)
WBC: 16.9 10*3/uL — ABNORMAL HIGH (ref 3.6–11.0)

## 2016-08-11 SURGERY — LIGATION, FALLOPIAN TUBE, POSTPARTUM
Anesthesia: General | Laterality: Bilateral | Wound class: Clean

## 2016-08-11 MED ORDER — SUGAMMADEX SODIUM 500 MG/5ML IV SOLN
INTRAVENOUS | Status: DC | PRN
  Administered 2016-08-11: 137.8 mg via INTRAVENOUS

## 2016-08-11 MED ORDER — ONDANSETRON HCL 4 MG/2ML IJ SOLN: 4.0000 mg | Freq: Once | INTRAMUSCULAR | Status: DC | PRN

## 2016-08-11 MED ORDER — LIDOCAINE HCL (CARDIAC) 20 MG/ML IV SOLN
INTRAVENOUS | Status: DC | PRN
  Administered 2016-08-11: 60 mg via INTRAVENOUS

## 2016-08-11 MED ORDER — ROCURONIUM BROMIDE 100 MG/10ML IV SOLN
INTRAVENOUS | Status: DC | PRN
  Administered 2016-08-11: 30 mg via INTRAVENOUS

## 2016-08-11 MED ORDER — BUPIVACAINE HCL (PF) 0.5 % IJ SOLN
INTRAMUSCULAR | Status: AC
  Filled 2016-08-11: qty 30

## 2016-08-11 MED ORDER — FENTANYL CITRATE (PF) 100 MCG/2ML IJ SOLN
25.0000 ug | INTRAMUSCULAR | Status: DC | PRN
  Administered 2016-08-11 (×4): 25 ug via INTRAVENOUS

## 2016-08-11 MED ORDER — FENTANYL CITRATE (PF) 100 MCG/2ML IJ SOLN
INTRAMUSCULAR | Status: AC
  Filled 2016-08-11: qty 2

## 2016-08-11 MED ORDER — KETOROLAC TROMETHAMINE 30 MG/ML IJ SOLN
INTRAMUSCULAR | Status: DC | PRN
  Administered 2016-08-11: 30 mg via INTRAVENOUS

## 2016-08-11 MED ORDER — BUPIVACAINE HCL 0.5 % IJ SOLN
INTRAMUSCULAR | Status: DC | PRN
Start: 2016-08-11 — End: 2016-08-11
  Administered 2016-08-11: 15 mL

## 2016-08-11 MED ORDER — LACTATED RINGERS IV SOLN
INTRAVENOUS | Status: DC
  Administered 2016-08-11 (×2): via INTRAVENOUS

## 2016-08-11 MED ORDER — MIDAZOLAM HCL 2 MG/2ML IJ SOLN
INTRAMUSCULAR | Status: DC | PRN
  Administered 2016-08-11: 2 mg via INTRAVENOUS

## 2016-08-11 MED ORDER — FENTANYL CITRATE (PF) 100 MCG/2ML IJ SOLN
INTRAMUSCULAR | Status: DC | PRN
Start: 2016-08-11 — End: 2016-08-11
  Administered 2016-08-11: 100 ug via INTRAVENOUS

## 2016-08-11 MED ORDER — PROPOFOL 10 MG/ML IV BOLUS
INTRAVENOUS | Status: DC | PRN
  Administered 2016-08-11: 150 mg via INTRAVENOUS

## 2016-08-11 SURGICAL SUPPLY — 25 items
CHLORAPREP W/TINT 26ML (MISCELLANEOUS) ×3 IMPLANT
DRAPE LAPAROTOMY 100X77 ABD (DRAPES) ×3 IMPLANT
DRESSING TELFA 4X3 1S ST N-ADH (GAUZE/BANDAGES/DRESSINGS) ×3 IMPLANT
DRSG TEGADERM 2-3/8X2-3/4 SM (GAUZE/BANDAGES/DRESSINGS) ×3 IMPLANT
ELECT REM PT RETURN 9FT ADLT (ELECTROSURGICAL) ×3
ELECTRODE REM PT RTRN 9FT ADLT (ELECTROSURGICAL) ×1 IMPLANT
GLOVE BIO SURGEON STRL SZ7 (GLOVE) ×3 IMPLANT
GLOVE INDICATOR 7.5 STRL GRN (GLOVE) ×3 IMPLANT
GOWN STRL REUS W/ TWL LRG LVL3 (GOWN DISPOSABLE) ×2 IMPLANT
GOWN STRL REUS W/TWL LRG LVL3 (GOWN DISPOSABLE) ×4
LABEL OR SOLS (LABEL) ×3 IMPLANT
LIQUID BAND (GAUZE/BANDAGES/DRESSINGS) ×3 IMPLANT
NDL SAFETY 22GX1.5 (NEEDLE) ×3 IMPLANT
NS IRRIG 500ML POUR BTL (IV SOLUTION) ×3 IMPLANT
PACK BASIN MINOR ARMC (MISCELLANEOUS) ×3 IMPLANT
SPONGE LAP 4X18 5PK (MISCELLANEOUS) ×3 IMPLANT
STRAP SAFETY BODY (MISCELLANEOUS) ×3 IMPLANT
SUT CHROMIC GUT BROWN 0 54 (SUTURE) ×1 IMPLANT
SUT CHROMIC GUT BROWN 0 54IN (SUTURE) ×3
SUT MNCRL 4-0 (SUTURE) ×2
SUT MNCRL 4-0 27XMFL (SUTURE) ×1
SUT VIC AB 0 UR5 27 (SUTURE) ×3 IMPLANT
SUT VIC AB 2-0 UR6 27 (SUTURE) ×6 IMPLANT
SUTURE MNCRL 4-0 27XMF (SUTURE) ×1 IMPLANT
SYRINGE 10CC LL (SYRINGE) ×3 IMPLANT

## 2016-08-11 NOTE — Transfer of Care (Signed)
Immediate Anesthesia Transfer of Care Note  Patient: Barbara Moreno  Procedure(s) Performed: Procedure(s): POST PARTUM TUBAL LIGATION (Bilateral)  Patient Location: PACU  Anesthesia Type:General  Level of Consciousness: sedated  Airway & Oxygen Therapy: Patient Spontanous Breathing  Post-op Assessment: Report given to RN and Post -op Vital signs reviewed and stable  Post vital signs: Reviewed and stable  Last Vitals:  Vitals:   08/11/16 0800 08/11/16 1141  BP: 104/64 (!) 144/77  Pulse: 90 94  Resp: 18 18  Temp: 36.7 C 36.4 C    Last Pain:  Vitals:   08/11/16 1141  TempSrc: Tympanic  PainSc: 0-No pain         Complications: No apparent anesthesia complications

## 2016-08-11 NOTE — Op Note (Signed)
Preoperative Diagnosis: 1) 25 y.o.  [id number] desiring permanent surgical sterilization  Postoperative Diagnosis: 1) 25 y.o. [id number] desiring permanent surgical sterilization  Operation Performed: Postpartum tuba ligation via Pomeroy method  Indication: 25 y.o. [id number]  with undesired fertility, desires permanent sterilization.  Other reversible forms of contraception were discussed with patient; she declines all other modalities. Permanent nature of as well as associated risks of the procedure discussed with patient including but not limited to: risk of regret, permanence of method, bleeding, infection, injury to surrounding organs and need for additional procedures.  Failure risk of 0.5-1% with increased risk of ectopic gestation if pregnancy occurs was also discussed with patient.    Anesthesia: General  Preoperative Antibiotics: none  Estimated Blood Loss: 2mL  Drains or Tubes: none  Implants: none  Specimens Removed: none  Complications: none  Intraoperative Findings: Normal tubes, ovaries, and uterus.  Good 1cm knuckle of tube removed each side  Patient Condition: stable  Procedure in Detail:  Patient was taken to the operating room where she was administered general anesthesia.  She was positioned in the supine position, prepped and draped in the usual sterile fashion.  Prior to proceeding with procedure a time out was performed.    Attention was turned to the patient's abdomen.  The umbilicus was infiltrated with 1% Sensorcaine, before making a stab incision using an 11 blade scalpel.  The underlying subcutaneous fat was dissected off the fascia bluntly using a hemostat and Public librarianarmy [employer] retractors.  The fascia was grasped with a hemostat, tented up and re-grasped with a second hemostat to ensure no underlying bowl was inadvertently grasped.  Tenting the fascia up it was incised using mayo scissors.  The table was put into right tilt, a tagged mini-lap was then introduced into the  incision to pack away bowl and omentum.  The left tube was identified and grasped with a babcock clamp, walked out to its fimbriated portion.  The tube was doubly suture ligated usin a 0 chromic wheel.  The intervening knuckle of tube was excised, ostia were visualized and tube was hemostatic prior to returning it to the abdomen.  The table was tilted to the left and the same procedure was repeated for the patient's right tube.    The fascia was closed using a 0 Vicryl in a running fashion.  Skin was closed using 4-0 Monocryl in a subcuticular fashion.  The incision was then dressed with dermabond.  Sponge needle and instrument counts were correct time two.  The patient tolerated the procedure well and was taken to the recovery room in stable condition.

## 2016-08-11 NOTE — Anesthesia Preprocedure Evaluation (Signed)
Anesthesia Evaluation  Patient identified by MRN, date of birth, ID band Patient awake    Reviewed: Allergy & Precautions, NPO status , Patient's Chart, lab work & pertinent test results  Airway Mallampati: II       Dental  (+) Teeth Intact   Pulmonary neg pulmonary ROS, former smoker,    Pulmonary exam normal        Cardiovascular Exercise Tolerance: Good negative cardio ROS   Rhythm:Regular Rate:Normal     Neuro/Psych negative neurological ROS     GI/Hepatic negative GI ROS, Neg liver ROS,   Endo/Other  negative endocrine ROS  Renal/GU negative Renal ROS  negative genitourinary   Musculoskeletal negative musculoskeletal ROS (+)   Abdominal Normal abdominal exam  (+)   Peds negative pediatric ROS (+)  Hematology  (+) anemia ,   Anesthesia Other Findings   Reproductive/Obstetrics                             Anesthesia Physical Anesthesia Plan  ASA: I  Anesthesia Plan: General   Post-op Pain Management:    Induction: Intravenous  Airway Management Planned: Oral ETT  Additional Equipment:   Intra-op Plan:   Post-operative Plan: Extubation in OR  Informed Consent: I have reviewed the patients History and Physical, chart, labs and discussed the procedure including the risks, benefits and alternatives for the proposed anesthesia with the patient or authorized representative who has indicated his/her understanding and acceptance.     Plan Discussed with: CRNA  Anesthesia Plan Comments:         Anesthesia Quick Evaluation

## 2016-08-11 NOTE — Progress Notes (Signed)
  Subjective:  Doing well, minimal lochia, no fevers, no chills  Objective:   Blood pressure (!) 96/45, pulse 72, temperature 98.2 F (36.8 C), temperature source Oral, resp. rate 18, height 5\' 2"  (1.575 m), weight 152 lb (68.9 kg), last menstrual period 10/31/2015, SpO2 98 %, unknown if currently breastfeeding.  General: NAD Pulmonary: no increased work of breathing Abdomen: non-distended, non-tender, fundus firm at level of umbilicus Extremities: no edema, no erythema, no tenderness  Results for orders placed or performed during the hospital encounter of 08/09/16 (from the past 72 hour(s))  CBC     Status: None   Collection Time: 08/09/16  5:48 PM  Result Value Ref Range   WBC 7.8 3.6 - 11.0 K/uL   RBC 4.42 3.80 - 5.20 MIL/uL   Hemoglobin 12.7 12.0 - 16.0 g/dL   HCT 16.137.1 09.635.0 - 04.547.0 %   MCV 83.9 80.0 - 100.0 fL   MCH 28.7 26.0 - 34.0 pg   MCHC 34.2 32.0 - 36.0 g/dL   RDW 40.913.8 81.111.5 - 91.414.5 %   Platelets 150 150 - 440 K/uL  Type and screen [facility]     Status: None   Collection Time: 08/09/16  5:48 PM  Result Value Ref Range   ABO/RH(D) A POS    Antibody Screen NEG    Sample Expiration 08/12/2016   RPR     Status: None   Collection Time: 08/09/16  5:48 PM  Result Value Ref Range   RPR Ser Ql Non Reactive Non Reactive    Comment: (NOTE) Performed At: [facility] [address] [place], [place] [id number] Mila HomerHancock William F MD YQ:[id number]:[phone]   Chlamydia/NGC rt PCR ([facility] only)     Status: None   Collection Time: 08/09/16  5:48 PM  Result Value Ref Range   Specimen source GC/Chlam URINE, RANDOM    Chlamydia Tr NOT DETECTED NOT DETECTED   N gonorrhoeae NOT DETECTED NOT DETECTED    Comment: (NOTE) 100  This methodology has not been evaluated in pregnant women or in 200  patients with a history of hysterectomy. 300 400  This methodology will not be performed on patients less than 4814  years of age.   CBC     Status: Abnormal   Collection  Time: 08/11/16  5:09 AM  Result Value Ref Range   WBC 16.9 (H) 3.6 - 11.0 K/uL   RBC 4.10 3.80 - 5.20 MIL/uL   Hemoglobin 11.7 (L) 12.0 - 16.0 g/dL   HCT 52.834.7 (L) 41.335.0 - 24.447.0 %   MCV 84.6 80.0 - 100.0 fL   MCH 28.5 26.0 - 34.0 pg   MCHC 33.6 32.0 - 36.0 g/dL   RDW 01.014.4 27.211.5 - 53.614.5 %   Platelets 169 150 - 440 K/uL    Assessment:   25 y.o. [id number] postpartum day #1 TSVD, desiring permanent surgical sterilization  Plan:    1) Acute blood loss anemia - hemodynamically stable and asymptomatic - po ferrous sulfate  2) --/--/A POS (09/19 1748) Ishmael Holter/Rubella Immune (01/28 0000) / Varicella Immune  3) TDAP status UTD  4) Breast/BTL (postpartum BTL today).  Discussed risk of regret for patient under 30, irreversible nature of the procedure, and 1% risk of lifetime failure  5) Disposition - anticipate discharge PPD2

## 2016-08-11 NOTE — Anesthesia Procedure Notes (Signed)
Procedure Name: Intubation Date/Time: 08/11/2016 12:19 PM Performed by: Almeta MonasFLETCHER, Frida Wahlstrom Pre-anesthesia Checklist: Patient identified, Emergency Drugs available, Suction available, Patient being monitored and Timeout performed Patient Re-evaluated:Patient Re-evaluated prior to inductionOxygen Delivery Method: Circle system utilized Preoxygenation: Pre-oxygenation with 100% oxygen Intubation Type: IV induction Ventilation: Mask ventilation without difficulty Laryngoscope Size: Mac and 3 Grade View: Grade I Tube type: Oral Tube size: 7.0 mm Number of attempts: 1 Airway Equipment and Method: Stylet Placement Confirmation: ETT inserted through vocal cords under direct vision,  positive ETCO2,  CO2 detector and breath sounds checked- equal and bilateral Secured at: 21 cm Tube secured with: Tape Dental Injury: Teeth and Oropharynx as per pre-operative assessment

## 2016-08-11 NOTE — Anesthesia Postprocedure Evaluation (Signed)
Anesthesia Post Note  Patient: Barbara Moreno  Procedure(s) Performed: Procedure(s) (LRB): POST PARTUM TUBAL LIGATION (Bilateral)  Patient location during evaluation: PACU Anesthesia Type: General Level of consciousness: awake Pain management: pain level controlled Vital Signs Assessment: post-procedure vital signs reviewed and stable Respiratory status: spontaneous breathing Cardiovascular status: stable Anesthetic complications: no    Last Vitals:  Vitals:   08/11/16 1335 08/11/16 1350  BP: 110/64 113/74  Pulse: 76 84  Resp: 14 13  Temp:      Last Pain:  Vitals:   08/11/16 1350  TempSrc:   PainSc: 8                  VAN STAVEREN,Anah Billard

## 2016-08-11 NOTE — OR Nursing (Signed)
IV right hand patent with LR infusing.

## 2016-08-12 ENCOUNTER — Encounter: Payer: Self-pay | Admitting: Obstetrics and Gynecology

## 2016-08-12 LAB — SURGICAL PATHOLOGY

## 2016-08-12 MED ORDER — IBUPROFEN 600 MG PO TABS: 600.0000 mg | ORAL_TABLET | Freq: Four times a day (QID) | ORAL | 0 refills | Status: AC

## 2016-08-12 MED ORDER — HYDROCODONE-ACETAMINOPHEN 5-325 MG PO TABS: 1.0000 | ORAL_TABLET | Freq: Four times a day (QID) | ORAL | 0 refills | Status: AC | PRN

## 2016-08-12 NOTE — Progress Notes (Signed)
Pt discharged home with infant.  Discharge instructions and follow up appointment given to and reviewed with pt.  Pt verbalized understanding.  Escorted by auxillary. 

## 2017-03-27 ENCOUNTER — Emergency Department
Admission: EM | Admit: 2017-03-27 | Discharge: 2017-03-27 | Disposition: A | Discharge status: Home / Self Care | Payer: Medicaid Other | Attending: Emergency Medicine | Admitting: Emergency Medicine

## 2017-03-27 ENCOUNTER — Encounter: Payer: Self-pay | Admitting: Emergency Medicine

## 2017-03-27 DIAGNOSIS — Z5321 Procedure and treatment not carried out due to patient leaving prior to being seen by health care provider: Secondary | ICD-10-CM | POA: Diagnosis not present

## 2017-03-27 DIAGNOSIS — Y929 Unspecified place or not applicable: Secondary | ICD-10-CM | POA: Insufficient documentation

## 2017-03-27 DIAGNOSIS — Y9389 Activity, other specified: Secondary | ICD-10-CM | POA: Diagnosis not present

## 2017-03-27 DIAGNOSIS — Y999 Unspecified external cause status: Secondary | ICD-10-CM | POA: Diagnosis not present

## 2017-03-27 DIAGNOSIS — Z87891 Personal history of nicotine dependence: Secondary | ICD-10-CM | POA: Insufficient documentation

## 2017-03-27 DIAGNOSIS — S0993XA Unspecified injury of face, initial encounter: Secondary | ICD-10-CM | POA: Diagnosis present

## 2017-03-27 DIAGNOSIS — R6884 Jaw pain: Secondary | ICD-10-CM | POA: Insufficient documentation

## 2017-03-27 NOTE — ED Triage Notes (Signed)
Throat auscultated, no stridor, no evidence of respiratory distress.
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
## 2017-03-29 ENCOUNTER — Encounter: Payer: Self-pay | Admitting: *Deleted

## 2017-03-29 ENCOUNTER — Telehealth: Payer: Self-pay | Admitting: Emergency Medicine

## 2017-03-29 ENCOUNTER — Ambulatory Visit
Admission: EM | Admit: 2017-03-29 | Discharge: 2017-03-29 | Disposition: A | Discharge status: Home / Self Care | Payer: Medicaid Other | Attending: Family Medicine | Admitting: Family Medicine

## 2017-03-29 ENCOUNTER — Ambulatory Visit: Payer: Medicaid Other

## 2017-03-29 DIAGNOSIS — M542 Cervicalgia: Secondary | ICD-10-CM | POA: Diagnosis not present

## 2017-03-29 DIAGNOSIS — R6884 Jaw pain: Secondary | ICD-10-CM | POA: Diagnosis present

## 2017-03-29 DIAGNOSIS — R070 Pain in throat: Secondary | ICD-10-CM | POA: Diagnosis not present
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
## 2017-03-29 NOTE — Discharge Instructions (Signed)
Tylenol and/or advil as needed, rest, ice
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
## 2018-02-01 ENCOUNTER — Other Ambulatory Visit: Payer: Self-pay

## 2018-02-01 ENCOUNTER — Telehealth: Payer: Self-pay

## 2018-02-01 MED ORDER — VALACYCLOVIR HCL 500 MG PO TABS: 500.0000 mg | ORAL_TABLET | Freq: Every day | ORAL | 0 refills | Status: AC

## 2018-02-01 NOTE — Telephone Encounter (Signed)
Pt states she needs refill of valtrex, pharm says she doesn't have refills.  [phone]  Pt states she has an outbreak.  She is requesting the one time dosing as she doesn't like to take it every day.  Pharm correct in chart.

## 2018-02-01 NOTE — Telephone Encounter (Signed)
Pt has not been seen since 2007. She needs an annual exam, but may also get medication from PCP.  Message sent to sara Paschal to schedule annual

## 2018-02-01 NOTE — Telephone Encounter (Signed)
Pt scheduled for appointment 02/12/2018 for annual with you, can you prescribe valtrex for pt?

## 2018-02-01 NOTE — Telephone Encounter (Signed)
Not in chart, looks like pt has seen [facility]. [facility]

## 2018-02-01 NOTE — Telephone Encounter (Signed)
done

## 2018-02-01 NOTE — Telephone Encounter (Signed)
Pt is schedule 02/12/18 with RPH.

## 2018-02-12 ENCOUNTER — Ambulatory Visit: Payer: Self-pay | Admitting: Obstetrics & Gynecology

## 2018-02-26 ENCOUNTER — Ambulatory Visit (INDEPENDENT_AMBULATORY_CARE_PROVIDER_SITE_OTHER): Payer: Medicaid Other | Admitting: Obstetrics and Gynecology

## 2018-02-26 ENCOUNTER — Encounter: Payer: Self-pay | Admitting: Obstetrics and Gynecology

## 2018-02-26 ENCOUNTER — Other Ambulatory Visit: Payer: Self-pay

## 2018-02-26 VITALS — BP 98/60 | HR 76 | Ht 62.0 in | Wt 125.0 lb

## 2018-02-26 DIAGNOSIS — Z0000 Encounter for general adult medical examination without abnormal findings: Secondary | ICD-10-CM | POA: Diagnosis not present

## 2018-02-26 DIAGNOSIS — Z124 Encounter for screening for malignant neoplasm of cervix: Secondary | ICD-10-CM

## 2018-02-26 DIAGNOSIS — Z01419 Encounter for gynecological examination (general) (routine) without abnormal findings: Secondary | ICD-10-CM

## 2018-02-26 NOTE — Patient Instructions (Signed)
I value your feedback and entrusting us with your care. If you get a [facility] patient survey, I would appreciate you taking the time to let us know about your experience today. Thank you! 

## 2018-02-26 NOTE — Progress Notes (Signed)
PCP:  Raynelle Bring   Chief Complaint  Patient presents with  . Gynecologic Exam    rt breast lump?     HPI:      Ms. Barbara Moreno is a 27 y.o. [id number] who LMP was Patient's last menstrual period was 02/02/2018., presents today for her annual examination.  Her menses are regular every 28-30 days, lasting 7 days.  Dysmenorrhea none. She does not have intermenstrual bleeding.  Sex activity: single partner, contraception - tubal ligation.  Last Pap: February 23, 2015  Results were: no abnormalities /neg HPV DNA  Hx of STDs: HSV  There is no FH of breast cancer. There is no FH of ovarian cancer. The patient does do self-breast exams. She has noticed a RT breast lump for several yrs. Sx were more prominent in the past but decreased when she stopped smoking a couple yrs ago. She restarted smoking again and has noticed it again the past few months. She has lost about 10# recently. She notes tenderness if she palpates it frequently, but otherwise, no other sx.   Tobacco use: The patient currently smokes 1/2 packs of cigarettes per day for the past few years. Alcohol use: social drinker No drug use.  Exercise: moderately active  She does get adequate calcium and Vitamin D in her diet.   Past Medical History:  Diagnosis Date  . Anemia   . Herpes genitalis in women   . Ovarian cyst     Past Surgical History:  Procedure Laterality Date  . FRACTURE SURGERY     ankle surgery  . TONSILLECTOMY    . TUBAL LIGATION Bilateral 08/11/2016   Procedure: POST PARTUM TUBAL LIGATION;  Surgeon: Vena Austria, MD;  Location: [facility] ORS;  Service: Gynecology;  Laterality: Bilateral;  . TUBAL LIGATION Bilateral 08/11/2016    History reviewed. No pertinent family history.  Social History   Socioeconomic History  . Marital status: Single    Spouse name: Not on file  . Number of children: Not on file  . Years of education: Not on file  . Highest education level: Not on file    Occupational History  . Not on file  Social Needs  . Financial resource strain: Not on file  . Food insecurity:    Worry: Not on file    Inability: Not on file  . Transportation needs:    Medical: Not on file    Non-medical: Not on file  Tobacco Use  . Smoking status: Current Every Day Smoker    Packs/day: 0.50    Types: Cigarettes  . Smokeless tobacco: Never Used  Substance and Sexual Activity  . Alcohol use: No  . Drug use: No  . Sexual activity: Yes    Birth control/protection: Other-see comments    Comment: Tubal  Lifestyle  . Physical activity:    Days per week: 5 days    Minutes per session: 30 min  . Stress: Not on file  Relationships  . Social connections:    Talks on phone: Not on file    Gets together: Not on file    Attends religious service: Not on file    Active member of club or organization: Not on file    Attends meetings of clubs or organizations: Not on file    Relationship status: Living with partner  . Intimate partner violence:    Fear of current or ex partner: No    Emotionally abused: No    Physically abused: No  Forced sexual activity: No  Other Topics Concern  . Not on file  Social History Narrative  . Not on file    Outpatient Medications Prior to Visit  Medication Sig Dispense Refill  . valACYclovir (VALTREX) 500 MG tablet Take 1 tablet (500 mg total) by mouth daily. 30 tablet 0  . HYDROcodone-acetaminophen (NORCO) 5-325 MG tablet Take 1 tablet by mouth every 6 (six) hours as needed for moderate pain. (Patient not taking: Reported on 02/26/2018) 20 tablet 0  . ibuprofen (ADVIL,MOTRIN) 600 MG tablet Take 1 tablet (600 mg total) by mouth every 6 (six) hours. (Patient not taking: Reported on 02/26/2018) 30 tablet 0  . Prenatal MV-Min-FA-Omega-3 (PRENATAL GUMMIES/DHA & FA PO) Take 1 tablet by mouth daily.     No facility-administered medications prior to visit.       ROS:  Review of Systems  Constitutional: Negative for fatigue, fever  and unexpected weight change.  Respiratory: Negative for cough, shortness of breath and wheezing.   Cardiovascular: Negative for chest pain, palpitations and leg swelling.  Gastrointestinal: Negative for blood in stool, constipation, diarrhea, nausea and vomiting.  Endocrine: Negative for cold intolerance, heat intolerance and polyuria.  Genitourinary: Negative for dyspareunia, dysuria, flank pain, frequency, genital sores, hematuria, menstrual problem, pelvic pain, urgency, vaginal bleeding, vaginal discharge and vaginal pain.  Musculoskeletal: Negative for back pain, joint swelling and myalgias.  Skin: Negative for rash.  Neurological: Negative for dizziness, syncope, light-headedness, numbness and headaches.  Hematological: Negative for adenopathy.  Psychiatric/Behavioral: Negative for agitation, confusion, sleep disturbance and suicidal ideas. The patient is not nervous/anxious.    BREAST: mass, tenderness   Objective: BP 98/60 (BP Location: Left Arm, Patient Position: Sitting, Cuff Size: Normal)   Pulse 76   Ht 5\' 2"  (1.575 m)   Wt 125 lb (56.7 kg)   LMP 02/02/2018   BMI 22.86 kg/m    Physical Exam  Constitutional: She is oriented to person, place, and time. She appears well-developed and well-nourished.  Genitourinary: Vagina normal and uterus normal. There is no rash or tenderness on the right labia. There is no rash or tenderness on the left labia. No erythema or tenderness in the vagina. No vaginal discharge found. Right adnexum does not display mass and does not display tenderness. Left adnexum does not display mass and does not display tenderness. Cervix does not exhibit motion tenderness or polyp. Uterus is not enlarged or tender.  Neck: Normal range of motion. No thyromegaly present.  Cardiovascular: Normal rate, regular rhythm and normal heart sounds.  No murmur heard. Pulmonary/Chest: Effort normal and breath sounds normal. Right breast exhibits no mass, no nipple  discharge, no skin change and no tenderness. Left breast exhibits no mass, no nipple discharge, no skin change and no tenderness.  AREA OF CONCERN RT BREAST IS RIB; NO DISCRETE MASSES    Abdominal: Soft. There is no tenderness. There is no guarding.  Musculoskeletal: Normal range of motion.  Neurological: She is alert and oriented to person, place, and time. No cranial nerve deficit.  Psychiatric: She has a normal mood and affect. Her behavior is normal.  Vitals reviewed.   Assessment/Plan: Encounter for annual routine gynecological examination  Cervical cancer screening - Plan: IGP, rfx Aptima HPV ASCU  Normal breast exam - Area in RT breast is rib. More likely prominent due to wt loss. Reassurance. F/u prn.          GYN counsel breast self exam, adequate intake of calcium and vitamin D, diet and exercise,  tobacco cessation     F/U  Return in about 1 year (around 02/27/2019).  Ajai Terhaar B. Jeffie Spivack, PA-C 02/26/2018 4:11 PM

## 2018-02-28 LAB — IGP, RFX APTIMA HPV ASCU: PAP SMEAR COMMENT: 0

## 2020-04-01 ENCOUNTER — Emergency Department: Payer: Self-pay

## 2020-04-01 ENCOUNTER — Emergency Department
Admission: EM | Admit: 2020-04-01 | Discharge: 2020-04-01 | Disposition: A | Discharge status: Home / Self Care | Payer: Self-pay | Attending: Student | Admitting: Student

## 2020-04-01 ENCOUNTER — Encounter: Payer: Self-pay | Admitting: Emergency Medicine

## 2020-04-01 ENCOUNTER — Other Ambulatory Visit: Payer: Self-pay

## 2020-04-01 DIAGNOSIS — F17210 Nicotine dependence, cigarettes, uncomplicated: Secondary | ICD-10-CM | POA: Insufficient documentation

## 2020-04-01 DIAGNOSIS — R1032 Left lower quadrant pain: Secondary | ICD-10-CM

## 2020-04-01 DIAGNOSIS — R109 Unspecified abdominal pain: Secondary | ICD-10-CM

## 2020-04-01 DIAGNOSIS — Z9851 Tubal ligation status: Secondary | ICD-10-CM | POA: Insufficient documentation

## 2020-04-01 DIAGNOSIS — N926 Irregular menstruation, unspecified: Secondary | ICD-10-CM | POA: Insufficient documentation

## 2020-04-01 LAB — COMPREHENSIVE METABOLIC PANEL
ALT: 10 U/L (ref 0–44)
AST: 16 U/L (ref 15–41)
Albumin: 4.4 g/dL (ref 3.5–5.0)
Alkaline Phosphatase: 27 U/L — ABNORMAL LOW (ref 38–126)
Anion gap: 7 (ref 5–15)
BUN: 10 mg/dL (ref 6–20)
CO2: 24 mmol/L (ref 22–32)
Calcium: 9.3 mg/dL (ref 8.9–10.3)
Chloride: 106 mmol/L (ref 98–111)
Creatinine, Ser: 0.59 mg/dL (ref 0.44–1.00)
GFR calc Af Amer: >60 mL/min (ref >60)
GFR calc non Af Amer: >60 mL/min (ref >60)
Glucose, Bld: 105 mg/dL — ABNORMAL HIGH (ref 70–99)
Potassium: 4.2 mmol/L (ref 3.5–5.1)
Sodium: 137 mmol/L (ref 135–145)
Total Bilirubin: 0.7 mg/dL (ref 0.3–1.2)
Total Protein: 7.4 g/dL (ref 6.5–8.1)

## 2020-04-01 LAB — URINALYSIS, COMPLETE (UACMP) WITH MICROSCOPIC
Bacteria, UA: NONE SEEN
Bilirubin Urine: NEGATIVE
Glucose, UA: NEGATIVE mg/dL
Hgb urine dipstick: NEGATIVE
Ketones, ur: NEGATIVE mg/dL
Leukocytes,Ua: NEGATIVE
Nitrite: NEGATIVE
Protein, ur: NEGATIVE mg/dL
Specific Gravity, Urine: 1.016 (ref 1.005–1.030)
pH: 6 (ref 5.0–8.0)

## 2020-04-01 LAB — CBC
HCT: 36.4 % (ref 36.0–46.0)
Hemoglobin: 12.5 g/dL (ref 12.0–15.0)
MCH: 29.5 pg (ref 26.0–34.0)
MCHC: 34.3 g/dL (ref 30.0–36.0)
MCV: 85.8 fL (ref 80.0–100.0)
Platelets: 204 10*3/uL (ref 150–400)
RBC: 4.24 MIL/uL (ref 3.87–5.11)
RDW: 13.2 % (ref 11.5–15.5)
WBC: 4.8 10*3/uL (ref 4.0–10.5)
nRBC: 0 % (ref 0.0–0.2)

## 2020-04-01 LAB — POCT PREGNANCY, URINE: Preg Test, Ur: NEGATIVE

## 2020-04-01 LAB — LIPASE, BLOOD: Lipase: 31 U/L (ref 11–51)

## 2020-04-01 MED ORDER — SODIUM CHLORIDE 0.9% FLUSH: 3.0000 mL | Freq: Once | INTRAVENOUS | Status: DC

## 2020-04-01 NOTE — ED Triage Notes (Signed)
Reports 3-4 months of irregular meses with intermittent shooting type pain to left pelvis.  States pain returned today.  Denies dysuria.  Last [facility] 03/31/2020

## 2020-04-01 NOTE — ED Provider Notes (Signed)
[facility] Emergency Department Provider Note  ____________________________________________   First MD Initiated Contact with Patient 04/01/20 1114     (approximate)  I have reviewed the triage vital signs and the nursing notes.  History  Chief Complaint Pelvic Pain    HPI Barbara Moreno is a 29 y.o. female with a history of irregular menses, ovarian cyst, who presents to the emergency department for intermittent lower abdominal pain.  Patient states symptoms have been ongoing for the last several months.  She has irregular menses, however it seems like exactly halfway between two menstrual cycles she develops intermittent lower abdominal pain.  This seems to switch sides, sometimes on the right, sometimes on the left.  Describes it as a sharp shooting pain.  She woke up this morning again with similar symptoms, this episode is on the left side.  Feels the majority of the pain in the left lower quadrant area, but also experienced some in the left flank area.  Comes and goes, sharp/shooting, nothing seems to make it better or worse. Moderate in severity. She denies any associated fevers, nausea, vomiting, dysuria.  No vaginal bleeding or discharge.  No concern for STD exposure.  No history of kidney stones.   Past Medical Hx Past Medical History:  Diagnosis Date  . Anemia   . Herpes genitalis in women   . Ovarian cyst     Problem List Patient Active Problem List   Diagnosis Date Noted  . Low amniotic fluid 08/09/2016  . Irregular contractions 08/02/2016    Past Surgical Hx Past Surgical History:  Procedure Laterality Date  . FRACTURE SURGERY     ankle surgery  . TONSILLECTOMY    . TUBAL LIGATION Bilateral 08/11/2016   Procedure: POST PARTUM TUBAL LIGATION;  Surgeon: Vena Austria, MD;  Location: [facility] ORS;  Service: Gynecology;  Laterality: Bilateral;  . TUBAL LIGATION Bilateral 08/11/2016    Medications Prior to Admission medications     Medication Sig Start Date End Date Taking? Authorizing Provider  HYDROcodone-acetaminophen (NORCO) 5-325 MG tablet Take 1 tablet by mouth every 6 (six) hours as needed for moderate pain. Patient not taking: Reported on 02/26/2018 08/12/16   Conard Novak, MD  ibuprofen (ADVIL,MOTRIN) 600 MG tablet Take 1 tablet (600 mg total) by mouth every 6 (six) hours. Patient not taking: Reported on 02/26/2018 08/12/16   Conard Novak, MD  Prenatal MV-Min-FA-Omega-3 (PRENATAL GUMMIES/DHA & FA PO) Take 1 tablet by mouth daily.    [provider]    Allergies Penicillins  Family Hx No family history on file.  Social Hx Social History   Tobacco Use  . Smoking status: Current Every Day Smoker    Packs/day: 0.50    Types: Cigarettes  . Smokeless tobacco: Never Used  Substance Use Topics  . Alcohol use: No  . Drug use: No     Review of Systems  Constitutional: Negative for fever. Negative for chills. Eyes: Negative for visual changes. ENT: Negative for sore throat. Cardiovascular: Negative for chest pain. Respiratory: Negative for shortness of breath. Gastrointestinal: Negative for nausea. Negative for vomiting.  Genitourinary: Negative for dysuria. Musculoskeletal: Negative for leg swelling. Skin: Negative for rash. Neurological: Negative for headaches.   Physical Exam  Vital Signs: ED Triage Vitals  Enc Vitals Group     BP 04/01/20 0959 100/67     Pulse Rate 04/01/20 0959 92     Resp 04/01/20 0959 16     Temp 04/01/20 0959 97.8 F (  36.6 C)     Temp Source 04/01/20 0959 Oral     SpO2 04/01/20 0959 97 %     Weight 04/01/20 0957 125 lb (56.7 kg)     Height 04/01/20 0957 5\' 2"  (1.575 m)     Head Circumference --      Peak Flow --      Pain Score 04/01/20 0957 6     Pain Loc --      Pain Edu? --      Excl. in [place]? --     Constitutional: Alert and oriented. Well appearing. NAD.  Head: Normocephalic. Atraumatic. Eyes: Conjunctivae clear. Sclera anicteric.  Pupils equal and symmetric. Nose: No masses or lesions. No congestion or rhinorrhea. Mouth/Throat: Wearing mask.  Neck: No stridor. Trachea midline.  Cardiovascular: Normal rate, regular rhythm. Extremities well perfused. Respiratory: Normal respiratory effort.  Lungs CTAB. Gastrointestinal: Soft. Non-distended. Non-tender throughout to deep palpation.  No rebound, guarding, or rigidity Genitourinary: Deferred.  Offered to patient, however as she has been monogamous with the same partner, no concern for STD exposure, no vaginal bleeding or discharge she would like to defer.  Feel this is reasonable. Musculoskeletal: No lower extremity edema. No deformities. Neurologic:  Normal speech and language. No gross focal or lateralizing neurologic deficits are appreciated.  Skin: Skin is warm, dry and intact. No rash noted. Psychiatric: Mood and affect are appropriate for situation.     Radiology  Personally reviewed available imaging myself.   US Pelvis - IMPRESSION:  Normal pelvic ultrasound.   CT Renal - IMPRESSION:  No findings to account for reported symptoms.    Procedures  Procedure(s) performed (including critical care):  Procedures   Initial Impression / Assessment and Plan / MDM / ED Course  29 y.o. female who presents to the ED for irregular menses, intermittent pain between her menstrual cycles.  Today, she is in between menstrual cycles and reports intermittent sharp shooting pain on the left, both in the left flank area as well as in the left lower quadrant.  Ddx: torsion, ruptured ovarian cyst, nephroureterolithiasis, pregnancy, ovulation pain/mittelschmerz, menstrual cramps, irregular menses.  No new sexual partners, no concern for STD exposure, no vaginal discharge concerning for PID or other pelvic infection.  Will plan for labs, urine studies, imaging  Work-up essentially unremarkable.  Pelvic ultrasound normal, normal ovarian flow, no adnexal masses.  Urine  negative for infection and pregnancy.  CBC and CMP without actionable derangements.  CT renal also negative.  As such, given negative work-up feel patient is stable for discharge with outpatient follow-up.  Suspect presentation consistent with irregular menses and potential ovulation pain/mittelschmerz.  Advise she follow-up with OB/GYN in clinic.  She voices understanding of this and is comfortable with the plan and discharge.  Given return precautions.   _______________________________   As part of my medical decision making I have reviewed available labs, radiology tests, reviewed old records/performed chart review.    Final Clinical Impression(s) / ED Diagnosis  Final diagnoses:  Irregular menses  Intermittent abdominal pain       Note:  This document was prepared using Dragon voice recognition software and may include unintentional dictation errors.   Lilia Pro., MD 04/01/20 1343

## 2020-04-01 NOTE — Discharge Instructions (Addendum)
Thank you for letting us take care of you in the emergency department today.   Please continue to take any regular, prescribed medications.  Take over-the-counter ibuprofen and Tylenol as directed on the box to continue help with your symptoms.  Please follow up with: OB/GYN doctor to review your ER visit and follow up on your symptoms.   Please return to the ER for any new or worsening symptoms.

## 2020-09-25 IMAGING — CT CT RENAL STONE PROTOCOL
3 of 4 series · 9 of 46 positions shown, 16 images · non-contrast
Comparison: [id number]

CLINICAL DATA: Left flank pain

EXAM:
CT ABDOMEN AND PELVIS WITHOUT CONTRAST
TECHNIQUE: Multidetector CT imaging of the abdomen and pelvis was performed
following the standard protocol without IV contrast.

[Series 4: lung bases · axial · 0.69mm/px · z∈[-191,-116]mm · 5 of 23 slices shown, 10 images]
[im 4/23  soft-tissue]
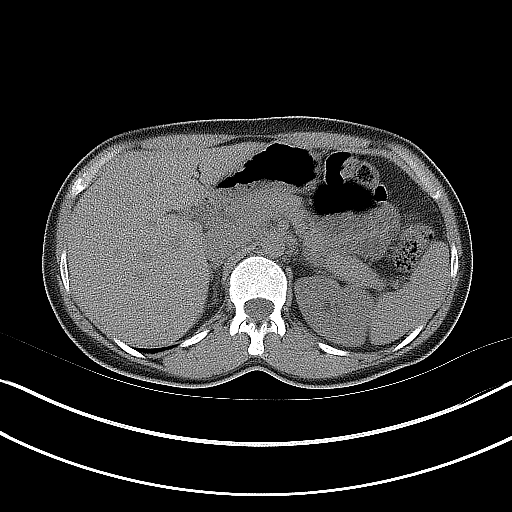
[im 4/23  bone]
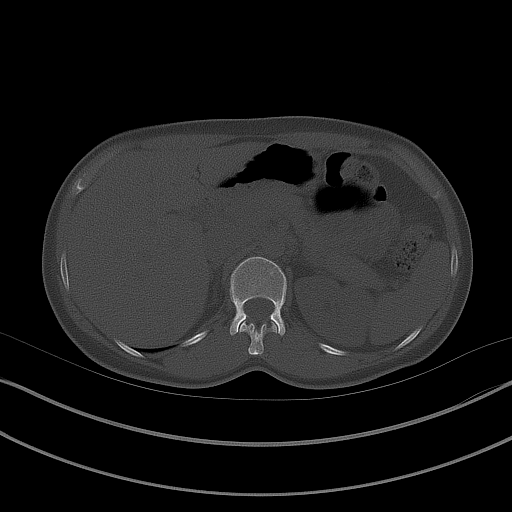
[im 8/23  soft-tissue]
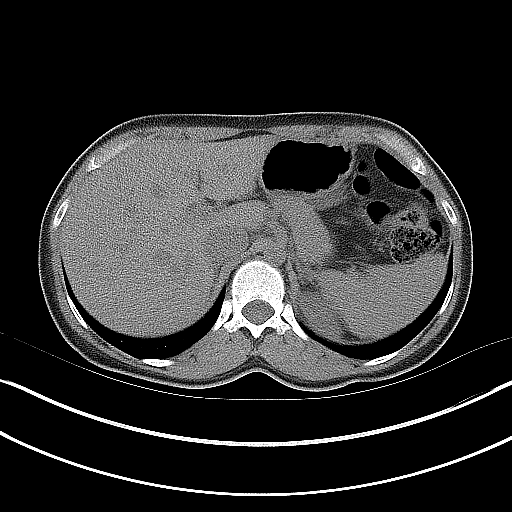
[im 8/23  lung]
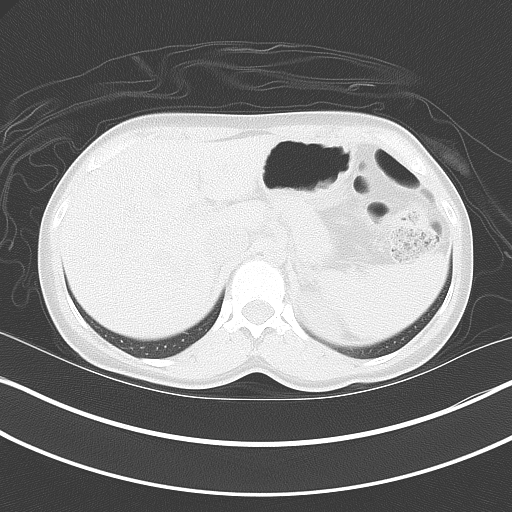
[im 12/23  soft-tissue]
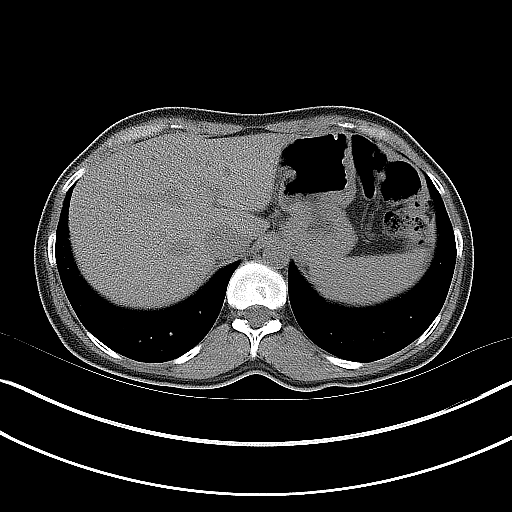
[im 12/23  lung]
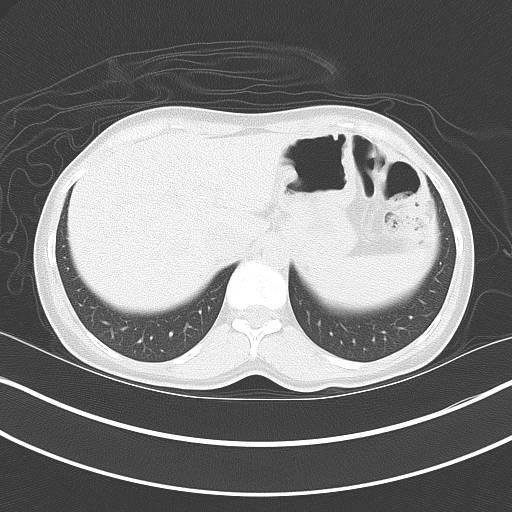
[im 15/23  soft-tissue]
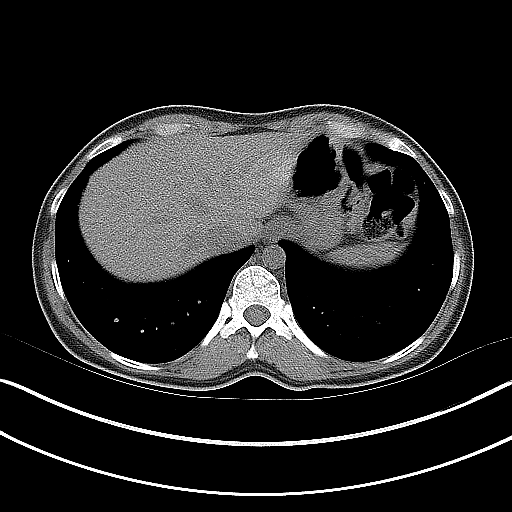
[im 15/23  lung]
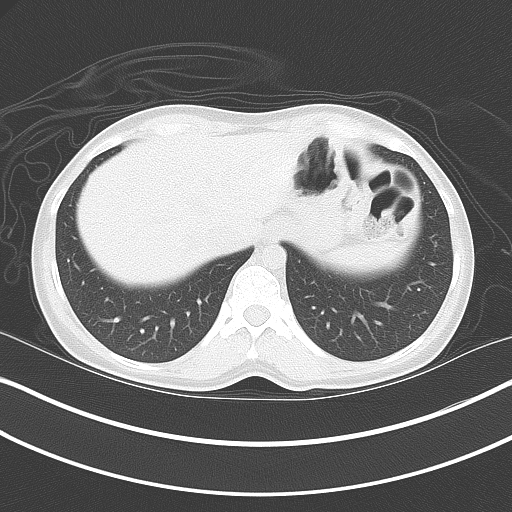
[im 19/23  soft-tissue]
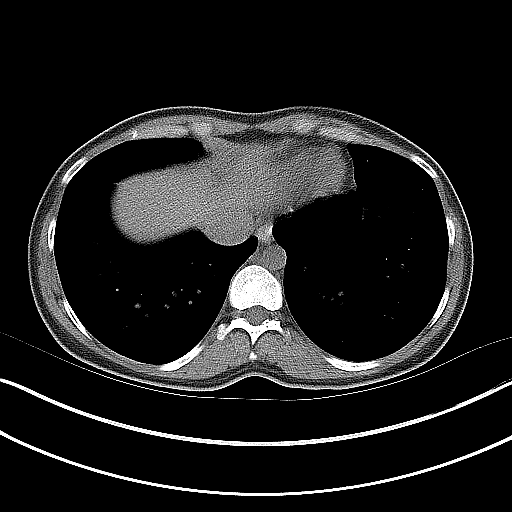
[im 19/23  lung]
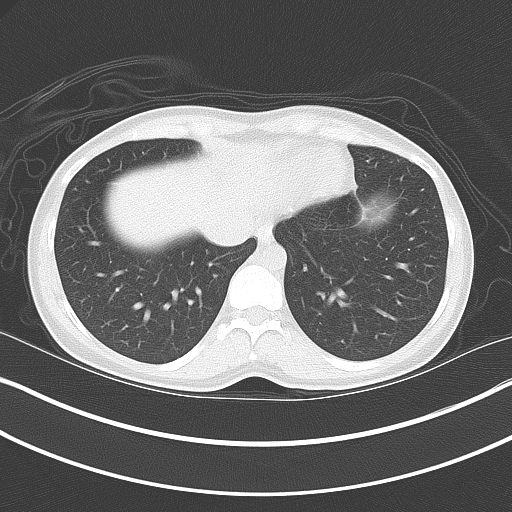

[Series 5: coronal · coronal · 0.80mm/px · 3 of 94 slices shown, 4 images]
[im 32/94  soft-tissue]
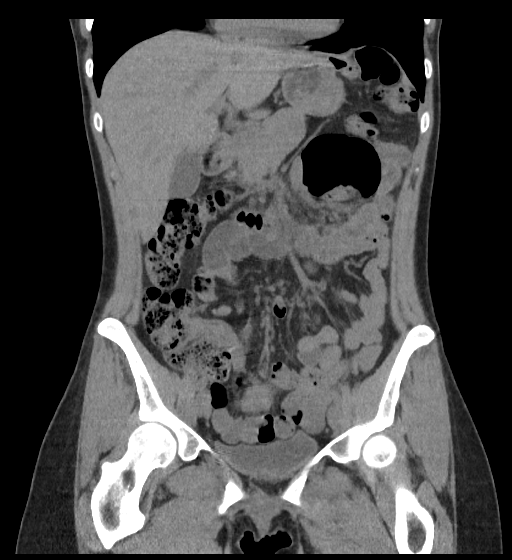
[im 42/94  soft-tissue]
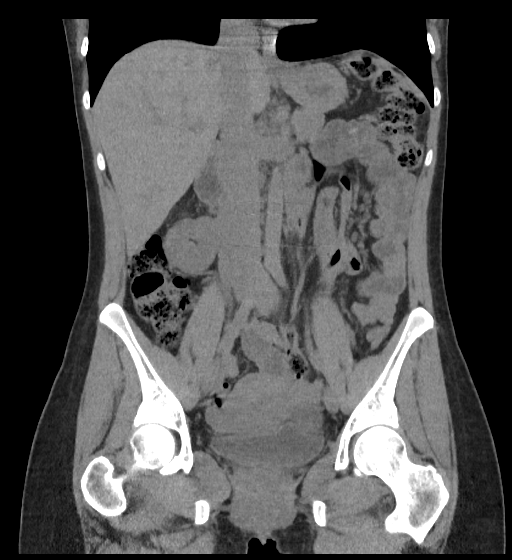
[im 42/94  bone]
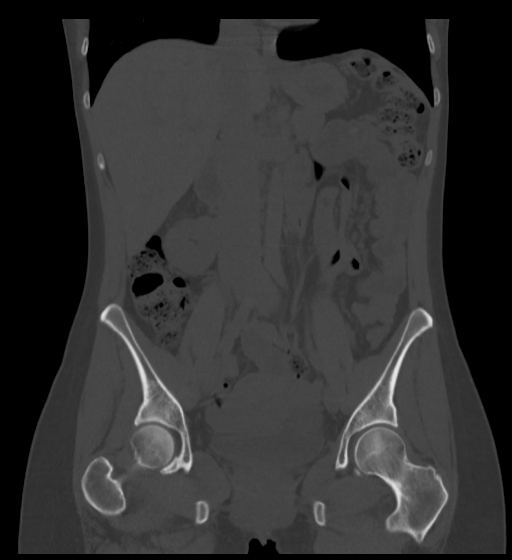
[im 52/94  soft-tissue]
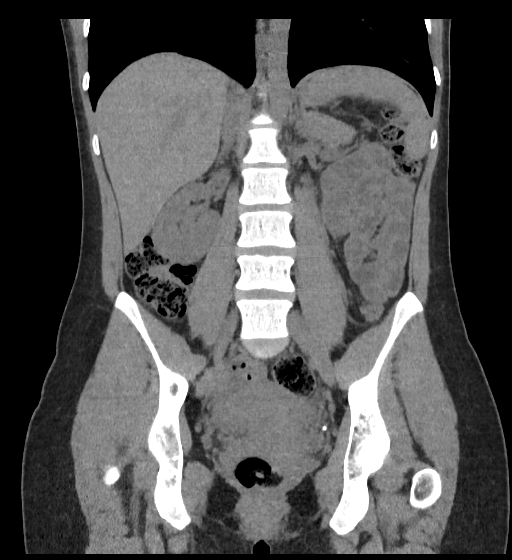

[Series 6: sagittal · sagittal · 0.53mm/px · 1 of 151 slices shown, 2 images]
[im 51/151  soft-tissue]
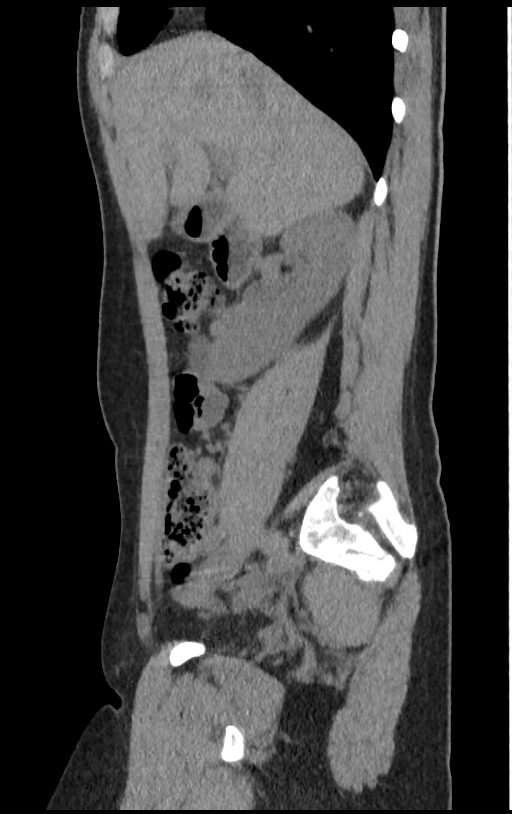
[im 51/151  bone]
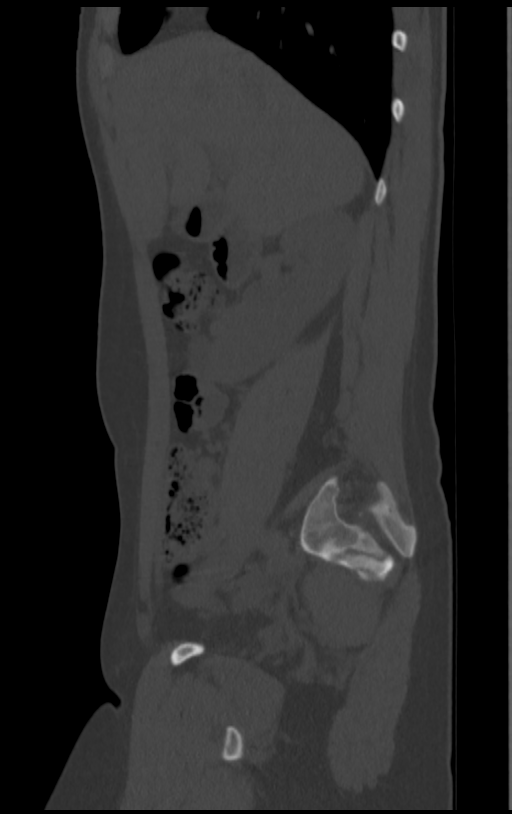

[9 of 46 positions shown; findings below may reference images not displayed]

FINDINGS: Lower chest: No acute abnormality.

Hepatobiliary: No focal liver abnormality is seen. No gallstones,
gallbladder wall thickening, or biliary dilatation.

Pancreas: Unremarkable.

Spleen: Unremarkable.

Adrenals/Urinary Tract: Adrenals and kidneys are unremarkable.
Bladder appears within normal limits.

Stomach/Bowel: Stomach is within normal limits. Bowel is normal in
caliber.

Vascular/Lymphatic: No significant vascular findings on this
noncontrast study. No enlarged lymph nodes identified.

Reproductive: Uterus and bilateral adnexa are unremarkable.

Other: No ascites. Abdominal wall is unremarkable apart from a small
supraumbilical fat containing hernia.

Musculoskeletal: No acute or significant osseous findings.
IMPRESSION: No findings to account for reported symptoms.
